# Patient Record
Sex: Female | Born: 1980 | Race: Black or African American | Hispanic: No | Smoking: Former smoker
Health system: Southern US, Community
[De-identification: ages and names within clinical notes are randomized; demographics above are authoritative.]

## PROBLEM LIST (undated history)

## (undated) DIAGNOSIS — I951 Orthostatic hypotension: Secondary | ICD-10-CM

## (undated) DIAGNOSIS — E7800 Pure hypercholesterolemia, unspecified: Secondary | ICD-10-CM

## (undated) DIAGNOSIS — T7840XA Allergy, unspecified, initial encounter: Secondary | ICD-10-CM

## (undated) DIAGNOSIS — N946 Dysmenorrhea, unspecified: Secondary | ICD-10-CM

## (undated) DIAGNOSIS — A64 Unspecified sexually transmitted disease: Secondary | ICD-10-CM

## (undated) HISTORY — PX: ABDOMINAL HYSTERECTOMY: SHX81

## (undated) HISTORY — PX: FRACTURE SURGERY: SHX138

## (undated) HISTORY — DX: Unspecified sexually transmitted disease: A64

## (undated) HISTORY — DX: Orthostatic hypotension: I95.1

## (undated) HISTORY — DX: Pure hypercholesterolemia, unspecified: E78.00

## (undated) HISTORY — DX: Dysmenorrhea, unspecified: N94.6

## (undated) HISTORY — PX: OTHER SURGICAL HISTORY: SHX169

## (undated) HISTORY — DX: Allergy, unspecified, initial encounter: T78.40XA

---

## 2010-11-13 ENCOUNTER — Inpatient Hospital Stay (INDEPENDENT_AMBULATORY_CARE_PROVIDER_SITE_OTHER)
Admission: RE | Admit: 2010-11-13 | Discharge: 2010-11-13 | Disposition: A | Discharge status: Home / Self Care | Payer: PRIVATE HEALTH INSURANCE | Source: Ambulatory Visit | Attending: Family Medicine | Admitting: Family Medicine

## 2010-11-13 DIAGNOSIS — J45909 Unspecified asthma, uncomplicated: Secondary | ICD-10-CM

## 2011-11-24 ENCOUNTER — Ambulatory Visit (INDEPENDENT_AMBULATORY_CARE_PROVIDER_SITE_OTHER): Payer: PRIVATE HEALTH INSURANCE | Admitting: Family Medicine

## 2011-11-24 ENCOUNTER — Other Ambulatory Visit (HOSPITAL_COMMUNITY)
Admission: RE | Admit: 2011-11-24 | Discharge: 2011-11-24 | Disposition: A | Discharge status: Home / Self Care | Payer: No Typology Code available for payment source | Source: Ambulatory Visit | Attending: Family Medicine | Admitting: Family Medicine

## 2011-11-24 ENCOUNTER — Encounter: Payer: Self-pay | Admitting: Family Medicine

## 2011-11-24 VITALS — BP 120/78 | HR 80 | Temp 98.1°F | Wt 191.0 lb

## 2011-11-24 DIAGNOSIS — Z01419 Encounter for gynecological examination (general) (routine) without abnormal findings: Secondary | ICD-10-CM | POA: Insufficient documentation

## 2011-11-24 DIAGNOSIS — G5603 Carpal tunnel syndrome, bilateral upper limbs: Secondary | ICD-10-CM

## 2011-11-24 DIAGNOSIS — Z0000 Encounter for general adult medical examination without abnormal findings: Secondary | ICD-10-CM | POA: Insufficient documentation

## 2011-11-24 DIAGNOSIS — R5383 Other fatigue: Secondary | ICD-10-CM | POA: Insufficient documentation

## 2011-11-24 DIAGNOSIS — R5381 Other malaise: Secondary | ICD-10-CM

## 2011-11-24 DIAGNOSIS — Z1159 Encounter for screening for other viral diseases: Secondary | ICD-10-CM | POA: Insufficient documentation

## 2011-11-24 DIAGNOSIS — Z136 Encounter for screening for cardiovascular disorders: Secondary | ICD-10-CM

## 2011-11-24 DIAGNOSIS — G5600 Carpal tunnel syndrome, unspecified upper limb: Secondary | ICD-10-CM

## 2011-11-24 LAB — COMPREHENSIVE METABOLIC PANEL
BUN: 10 mg/dL (ref 6–23)
CO2: 24 mEq/L (ref 19–32)
Creatinine, Ser: 0.7 mg/dL (ref 0.4–1.2)
GFR: 119.75 mL/min (ref >60.00)
Glucose, Bld: 92 mg/dL (ref 70–99)
Sodium: 140 mEq/L (ref 135–145)
Total Bilirubin: 1 mg/dL (ref 0.3–1.2)
Total Protein: 7.4 g/dL (ref 6.0–8.3)

## 2011-11-24 LAB — CBC WITH DIFFERENTIAL/PLATELET
Basophils Relative: 0.6 % (ref 0.0–3.0)
Eosinophils Relative: 3 % (ref 0.0–5.0)
HCT: 37.8 % (ref 36.0–46.0)
Hemoglobin: 12.9 g/dL (ref 12.0–15.0)
Lymphs Abs: 1.5 10*3/uL (ref 0.7–4.0)
Monocytes Relative: 10.2 % (ref 3.0–12.0)
Neutro Abs: 2.8 10*3/uL (ref 1.4–7.7)
RBC: 4.34 Mil/uL (ref 3.87–5.11)
WBC: 5.1 10*3/uL (ref 4.5–10.5)

## 2011-11-24 LAB — LIPID PANEL
Cholesterol: 208 mg/dL — ABNORMAL HIGH (ref 0–200)
VLDL: 12.8 mg/dL (ref 0.0–40.0)

## 2011-11-24 LAB — LDL CHOLESTEROL, DIRECT: Direct LDL: 160.9 mg/dL

## 2011-11-24 NOTE — Patient Instructions (Signed)
It was so nice to meet you. We will call you with your lab results. You can go to any pharmacy and buy wrist splints for carpal tunnel syndrome- please wear them at night. If no improvement, let me know.

## 2011-11-24 NOTE — Progress Notes (Signed)
Subjective:    Patient ID: Patty Garner, female    DOB: Feb 07, 1981, 31 y.o.   MRN: [id number]  HPI  31 yo here to establish care and for cpx.  G0- no h/o abnormal pap smears. Sexually active with females only, not on any OCPs. Periods are very heavy.  Sometimes gets lightheaded when she stands up. Tried OCPs in past, did not like how they made her feel.  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  At end of day, hands and fingers ache.  No radiculopathy or hand weakness. Sometimes quite fatigued.  No CP or SOB.  Patient Active Problem List  Diagnoses  . Fatigue  . Routine general medical examination at a health care facility  . Carpal tunnel syndrome, bilateral   No past medical history on file. No past surgical history on file. History  Substance Use Topics  . Smoking status: Current Some Day Smoker  . Smokeless tobacco: Not on file  . Alcohol Use: Not on file   No family history on file. Allergies  Allergen Reactions  . Tylenol With Codeine #3 (Acetaminophen-Codeine) Nausea And Vomiting   No current outpatient prescriptions on file prior to visit.   The PMH, PSH, Social History, Family History, Medications, and allergies have been reviewed in [facility], and have been updated if relevant.    Review of Systems See HPI Patient reports no  vision/ hearing changes,anorexia, weight change, fever ,adenopathy, persistant / recurrent hoarseness, swallowing issues, chest pain, edema,persistant / recurrent cough, hemoptysis, dyspnea(rest, exertional, paroxysmal nocturnal), gastrointestinal  bleeding (melena, rectal bleeding), abdominal pain, excessive heart burn, GU symptoms(dysuria, hematuria, pyuria, voiding/incontinence  Issues) syncope, focal weakness, severe memory loss, concerning skin lesions, depression, anxiety, abnormal bruising/bleeding, major joint swelling, breast masses or abnormal vaginal bleeding.       Objective:   Physical Exam BP 120/78  Pulse 80  Temp(Src)  98.1 F (36.7 C) (Oral)  Wt 191 lb (86.637 kg)  LMP 11/20/2011  General:  Well-developed,well-nourished,in no acute distress; alert,appropriate and cooperative throughout examination Head:  normocephalic and atraumatic.   Eyes:  vision grossly intact, pupils equal, pupils round, and pupils reactive to light.   Ears:  R ear normal and L ear normal.   Nose:  no external deformity.   Mouth:  good dentition.   Neck:  No deformities, masses, or tenderness noted. Breasts:  No mass, nodules, thickening, tenderness, bulging, retraction, inflamation, nipple discharge or skin changes noted.   Lungs:  Normal respiratory effort, chest expands symmetrically. Lungs are clear to auscultation, no crackles or wheezes. Heart:  Normal rate and regular rhythm. S1 and S2 normal without gallop, murmur, click, rub or other extra sounds. Abdomen:  Bowel sounds positive,abdomen soft and non-tender without masses, organomegaly or hernias noted. Rectal:  no external abnormalities.   Genitalia:  Pelvic Exam:        External: normal female genitalia without lesions or masses        Vagina: normal without lesions or masses        Cervix: normal without lesions or masses        Adnexa: normal bimanual exam without masses or fullness        Uterus: normal by palpation        Pap smear: performed Msk:  No deformity or scoliosis noted of thoracic or lumbar spine, positive Tinel test  Extremities:  No clubbing, cyanosis, edema, or deformity noted with normal full range of motion of all joints.   Neurologic:  alert & oriented X3 and gait normal.   Skin:  Intact without suspicious lesions or rashes Cervical Nodes:  No lymphadenopathy noted Axillary Nodes:  No palpable lymphadenopathy Psych:  Cognition and judgment appear intact. Alert and cooperative with normal attention span and concentration. No apparent delusions, illusions, hallucinations        Assessment & Plan:   1. Fatigue  Likely due to busy lifestyle  but will check other labs to rule out other reversible causes. CBC with Differential, TSH, T4, Free  2. Routine general medical examination at a health care facility  Reviewed preventive care protocols, scheduled due services, and updated immunizations Discussed nutrition, exercise, diet, and healthy lifestyle.  Comprehensive metabolic panel, Cytology - PAP  3. Carpal tunnel syndrome, bilateral  Advised night splinting and supportive care- see pt instructions for details.   4. Screening for ischemic heart disease  Lipid Panel

## 2011-12-02 ENCOUNTER — Encounter: Payer: Self-pay | Admitting: *Deleted

## 2011-12-02 LAB — HM PAP SMEAR: HM Pap smear: NORMAL

## 2011-12-26 ENCOUNTER — Other Ambulatory Visit: Payer: Self-pay | Admitting: Family Medicine

## 2011-12-26 MED ORDER — NORETHINDRONE ACET-ETHINYL EST 1.5-30 MG-MCG PO TABS: 1.0000 | ORAL_TABLET | Freq: Every day | ORAL | Status: DC

## 2011-12-26 NOTE — Telephone Encounter (Signed)
Rx for loestrin sent to her pharmacy.

## 2011-12-26 NOTE — Telephone Encounter (Signed)
Advised patient

## 2011-12-26 NOTE — Telephone Encounter (Signed)
Pt is having really bad cramps and wanted an RX for birth control.

## 2012-08-23 ENCOUNTER — Ambulatory Visit (INDEPENDENT_AMBULATORY_CARE_PROVIDER_SITE_OTHER): Payer: PRIVATE HEALTH INSURANCE | Admitting: Family Medicine

## 2012-08-23 ENCOUNTER — Encounter: Payer: Self-pay | Admitting: Family Medicine

## 2012-08-23 VITALS — BP 100/64 | HR 88 | Temp 97.8°F | Wt 204.0 lb

## 2012-08-23 DIAGNOSIS — N920 Excessive and frequent menstruation with regular cycle: Secondary | ICD-10-CM | POA: Insufficient documentation

## 2012-08-23 MED ORDER — ALBUTEROL SULFATE (2.5 MG/3ML) 0.083% IN NEBU: 2.5000 mg | INHALATION_SOLUTION | Freq: Four times a day (QID) | RESPIRATORY_TRACT | Status: DC | PRN

## 2012-08-23 MED ORDER — NORETHINDRONE ACET-ETHINYL EST 1.5-30 MG-MCG PO TABS: 1.0000 | ORAL_TABLET | Freq: Every day | ORAL | Status: DC

## 2012-08-23 MED ORDER — ALBUTEROL SULFATE HFA 108 (90 BASE) MCG/ACT IN AERS: 2.0000 | INHALATION_SPRAY | Freq: Four times a day (QID) | RESPIRATORY_TRACT | Status: DC | PRN

## 2012-08-23 NOTE — Progress Notes (Signed)
  Subjective:    Patient ID: Patty Garner, female    DOB: 07-Mar-1981, 32 y.o.   MRN: [id number]  HPI 32 yo here for follow up.  Menorrhagia- Sexually active with females only but we started Junel last year to help with menorrhagia.  This has helped tremendously with her cramping and heavy periods. She is very pleased with it and would like it refilled.  Last pap smear was normal in 11/2011.  She has no complaints today.   Patient Active Problem List  Diagnosis  . Fatigue  . Carpal tunnel syndrome, bilateral  . Menorrhagia   No past medical history on file. No past surgical history on file. History  Substance Use Topics  . Smoking status: Never Smoker   . Smokeless tobacco: Not on file  . Alcohol Use: Not on file   No family history on file. Allergies  Allergen Reactions  . Tylenol With Codeine #3 (Acetaminophen-Codeine) Nausea And Vomiting   Current Outpatient Prescriptions on File Prior to Visit  Medication Sig Dispense Refill  . Ginkgo Biloba (GNP GINGKO BILOBA EXTRACT PO) Take one by mouth daily      . Multiple Vitamin (MULTIVITAMIN) tablet Take 1 tablet by mouth daily.      . Norethindrone Acetate-Ethinyl Estradiol (JUNEL,LOESTRIN,MICROGESTIN) 1.5-30 MG-MCG tablet Take 1 tablet by mouth daily.  1 Package  11   The PMH, PSH, Social History, Family History, Medications, and allergies have been reviewed in [facility], and have been updated if relevant.    Review of Systems See HPI      Objective:   Physical Exam BP 100/64  Pulse 88  Temp 97.8 F (36.6 C)  Wt 204 lb (92.534 kg)  General:  Well-developed,well-nourished,in no acute distress; alert,appropriate and cooperative throughout examination Head:  normocephalic and atraumatic.   Eyes:  vision grossly intact, pupils equal, pupils round, and pupils reactive to light.   Ears:  R ear normal and L ear normal.   Nose:  no external deformity.   Mouth:  good dentition.   Lungs:  Normal respiratory effort, chest  expands symmetrically. Lungs are clear to auscultation, no crackles or wheezes. Heart:  Normal rate and regular rhythm. S1 and S2 normal without gallop, murmur, click, rub or other extra sounds. Abdomen:  Bowel sounds positive,abdomen soft and non-tender without masses, organomegaly or hernias noted. Extremities:  No clubbing, cyanosis, edema, or deformity noted with normal full range of motion of all joints.   Neurologic:  alert & oriented X3 and gait normal.   Skin:  Intact without suspicious lesions or rashes Psych:  Cognition and judgment appear intact. Alert and cooperative with normal attention span and concentration. No apparent delusions, illusions, hallucinations        Assessment & Plan:   1. Menorrhagia    Improved with Junel.  She is a non smoker.  Rx refilled.

## 2012-11-24 ENCOUNTER — Other Ambulatory Visit: Payer: Self-pay | Admitting: Family Medicine

## 2013-07-05 ENCOUNTER — Other Ambulatory Visit: Payer: Self-pay | Admitting: Family Medicine

## 2013-08-03 ENCOUNTER — Other Ambulatory Visit: Payer: Self-pay | Admitting: Family Medicine

## 2013-08-05 ENCOUNTER — Ambulatory Visit (INDEPENDENT_AMBULATORY_CARE_PROVIDER_SITE_OTHER): Payer: PRIVATE HEALTH INSURANCE | Admitting: Internal Medicine

## 2013-08-05 ENCOUNTER — Encounter: Payer: Self-pay | Admitting: Internal Medicine

## 2013-08-05 VITALS — BP 120/78 | HR 89 | Temp 98.8°F | Wt 202.0 lb

## 2013-08-05 DIAGNOSIS — N920 Excessive and frequent menstruation with regular cycle: Secondary | ICD-10-CM

## 2013-08-05 MED ORDER — NORETHINDRONE ACET-ETHINYL EST 1.5-30 MG-MCG PO TABS: ORAL_TABLET | ORAL | Status: DC

## 2013-08-05 NOTE — Progress Notes (Signed)
Pre-visit discussion using our clinic review tool. No additional management support is needed unless otherwise documented below in the visit note.  

## 2013-08-05 NOTE — Progress Notes (Signed)
Subjective:    Patient ID: Patty Garner, female    DOB: July 02, 1981, 33 y.o.   MRN: [id number]  HPI  Pt presents to the clinic today for birth control refill. She was started on Junel for menorrhagia. She reports improvement in her symptoms. She denies any negative side effects from the medication. Her last pap was 11/2011 and normal. She reports she gets them done every 3 years and will be due next year.  Review of Systems      History reviewed. No pertinent past medical history.  Current Outpatient Prescriptions  Medication Sig Dispense Refill  . Multiple Vitamin (MULTIVITAMIN) tablet Take 1 tablet by mouth daily.      . Multiple Vitamins-Minerals (HAIR/SKIN/NAILS PO) Take 2 capsules by mouth. Gummy      . Norethindrone Acetate-Ethinyl Estradiol (JUNEL 1.5/30) 1.5-30 MG-MCG tablet TAKE 1 TABLET BY MOUTH EVERY DAY  21 tablet  11   No current facility-administered medications for this visit.    Allergies  Allergen Reactions  . Tylenol With Codeine #3 [Acetaminophen-Codeine] Nausea And Vomiting    History reviewed. No pertinent family history.  History   Social History  . Marital Status: Single    Spouse Name: N/A    Number of Children: N/A  . Years of Education: N/A   Occupational History  . Not on file.   Social History Main Topics  . Smoking status: Former Research scientist (life sciences)  . Smokeless tobacco: Not on file  . Alcohol Use: Yes     Comment: moderate  . Drug Use: No  . Sexual Activity: Not on file   Other Topics Concern  . Not on file   Social History Narrative  . No narrative on file     Constitutional: Denies fever, malaise, fatigue, headache or abrupt weight changes.  Respiratory: Denies difficulty breathing, shortness of breath, cough or sputum production.   GU: Denies urgency, frequency, pain with urination, burning sensation, blood in urine, odor or discharge.   No other specific complaints in a complete review of systems (except as listed in HPI  above).  Objective:   Physical Exam  BP 120/78  Pulse 89  Temp(Src) 98.8 F (37.1 C) (Oral)  Wt 202 lb (91.627 kg)  SpO2 98%  LMP 08/02/2012 Wt Readings from Last 3 Encounters:  08/05/13 202 lb (91.627 kg)  08/23/12 204 lb (92.534 kg)  11/24/11 191 lb (86.637 kg)    General: Appears her stated age, obese but well developed, well nourished in NAD. Cardiovascular: Normal rate and rhythm. S1,S2 noted.  No murmur, rubs or gallops noted. No JVD or BLE edema. No carotid bruits noted. Pulmonary/Chest: Normal effort and positive vesicular breath sounds. No respiratory distress. No wheezes, rales or ronchi noted.  Abdomen: Soft and nontender. Normal bowel sounds, no bruits noted. No distention or masses noted. Liver, spleen and kidneys non palpable.   BMET    Component Value Date/Time   NA 140 11/24/2011 1154   K 3.7 11/24/2011 1154   CL 105 11/24/2011 1154   CO2 24 11/24/2011 1154   GLUCOSE 92 11/24/2011 1154   BUN 10 11/24/2011 1154   CREATININE 0.7 11/24/2011 1154   CALCIUM 9.2 11/24/2011 1154    Lipid Panel     Component Value Date/Time   CHOL 208* 11/24/2011 1154   TRIG 64.0 11/24/2011 1154   HDL 49.70 11/24/2011 1154   CHOLHDL 4 11/24/2011 1154   VLDL 12.8 11/24/2011 1154    CBC    Component Value Date/Time  WBC 5.1 11/24/2011 1154   RBC 4.34 11/24/2011 1154   HGB 12.9 11/24/2011 1154   HCT 37.8 11/24/2011 1154   PLT 270.0 11/24/2011 1154   MCV 87.2 11/24/2011 1154   MCHC 34.1 11/24/2011 1154   RDW 13.4 11/24/2011 1154   LYMPHSABS 1.5 11/24/2011 1154   MONOABS 0.5 11/24/2011 1154   EOSABS 0.1 11/24/2011 1154   BASOSABS 0.0 11/24/2011 1154    Hgb A1C No results found for this basename: HGBA1C         Assessment & Plan:

## 2013-08-05 NOTE — Patient Instructions (Signed)
Oral Contraception Information  Oral contraceptive pills (OCPs) are medicines taken to prevent pregnancy. OCPs work by preventing the ovaries from releasing eggs. The hormones in OCPs also cause the cervical mucus to thicken, preventing the sperm from entering the uterus. The hormones also cause the uterine lining to become thin, not allowing a fertilized egg to attach to the inside of the uterus. OCPs are highly effective when taken exactly as prescribed. However, OCPs do not prevent sexually transmitted diseases (STDs). Safe sex practices, such as using condoms along with the pill, can help prevent STDs.   Before taking the pill, you may have a physical exam and Pap test. Your health care provider may order blood tests. The health care provider will make sure you are a good candidate for oral contraception. Discuss with your health care provider the possible side effects of the OCP you may be prescribed. When starting an OCP, it can take 2 to 3 months for the body to adjust to the changes in hormone levels in your body.   TYPES OF ORAL CONTRACEPTION  · The combination pill This pill contains estrogen and progestin (synthetic progesterone) hormones. The combination pill comes in 21-day, 28-day, or 91-day packs. Some types of combination pills are meant to be taken continuously (365-day pills). With 21-day packs, you do not take pills for 7 days after the last pill. With 28-day packs, the pill is taken every day. The last 7 pills are without hormones. Certain types of pills have more than 21 hormone-containing pills. With 91-day packs, the first 84 pills contain both hormones, and the last 7 pills contain no hormones or contain estrogen only.  · The minipill This pill contains the progesterone hormone only. The pill is taken every day continuously. It is very important to take the pill at the same time each day. The minipill comes in packs of 28 pills. All 28 pills contain the hormone.    ADVANTAGES OF ORAL  CONTRACEPTIVE PILLS  · Decreases premenstrual symptoms.    · Treats menstrual period cramps.    · Regulates the menstrual cycle.    · Decreases a heavy menstrual flow.    · May treat acne, depending on the type of pill.    · Treats abnormal uterine bleeding.    · Treats polycystic ovarian syndrome.    · Treats endometriosis.    · Can be used as emergency contraception.    THINGS THAT CAN MAKE ORAL CONTRACEPTIVE PILLS LESS EFFECTIVE  OCPs can be less effective if:   · You forget to take the pill at the same time every day.    · You have a stomach or intestinal disease that lessens the absorption of the pill.    · You take OCPs with other medicines that make OCPs less effective, such as antibiotics, certain HIV medicines, and some seizure medicines.    · You take expired OCPs.    · You forget to restart the pill on day 7, when using the packs of 21 pills.    RISKS ASSOCIATED WITH ORAL CONTRACEPTIVE PILLS   Oral contraceptive pills can sometimes cause side effects, such as:  · Headache.  · Nausea.  · Breast tenderness.  · Irregular bleeding or spotting.  Combination pills are also associated with a small increased risk of:  · Blood clots.  · Heart attack.  · Stroke.  Document Released: 09/27/2002 Document Revised: 04/27/2013 Document Reviewed: 12/26/2012  [employer]® Patient Information ©2014 [employer].

## 2013-08-05 NOTE — Assessment & Plan Note (Signed)
Well controlled on Junel Will refill today Pap due next year

## 2014-06-07 ENCOUNTER — Ambulatory Visit (INDEPENDENT_AMBULATORY_CARE_PROVIDER_SITE_OTHER): Payer: BC Managed Care – PPO | Admitting: Family Medicine

## 2014-06-07 ENCOUNTER — Encounter: Payer: Self-pay | Admitting: Family Medicine

## 2014-06-07 VITALS — BP 116/62 | HR 81 | Temp 98.0°F | Ht 63.0 in | Wt 194.5 lb

## 2014-06-07 DIAGNOSIS — N921 Excessive and frequent menstruation with irregular cycle: Secondary | ICD-10-CM

## 2014-06-07 DIAGNOSIS — Z309 Encounter for contraceptive management, unspecified: Secondary | ICD-10-CM | POA: Insufficient documentation

## 2014-06-07 DIAGNOSIS — Z0000 Encounter for general adult medical examination without abnormal findings: Secondary | ICD-10-CM

## 2014-06-07 DIAGNOSIS — Z3041 Encounter for surveillance of contraceptive pills: Secondary | ICD-10-CM

## 2014-06-07 LAB — CBC WITH DIFFERENTIAL/PLATELET
BASOS ABS: 0 10*3/uL (ref 0.0–0.1)
BASOS PCT: 0.4 % (ref 0.0–3.0)
EOS ABS: 0.1 10*3/uL (ref 0.0–0.7)
Eosinophils Relative: 1.8 % (ref 0.0–5.0)
HCT: 37.5 % (ref 36.0–46.0)
HEMOGLOBIN: 12.4 g/dL (ref 12.0–15.0)
Lymphocytes Relative: 34.4 % (ref 12.0–46.0)
Lymphs Abs: 2 10*3/uL (ref 0.7–4.0)
MCHC: 33.2 g/dL (ref 30.0–36.0)
MCV: 86.9 fl (ref 78.0–100.0)
MONO ABS: 0.4 10*3/uL (ref 0.1–1.0)
Monocytes Relative: 7.3 % (ref 3.0–12.0)
NEUTROS ABS: 3.3 10*3/uL (ref 1.4–7.7)
NEUTROS PCT: 56.1 % (ref 43.0–77.0)
Platelets: 297 10*3/uL (ref 150.0–400.0)
RBC: 4.32 Mil/uL (ref 3.87–5.11)
RDW: 13.4 % (ref 11.5–15.5)
WBC: 5.8 10*3/uL (ref 4.0–10.5)

## 2014-06-07 LAB — LIPID PANEL
CHOLESTEROL: 245 mg/dL — AB (ref 0–200)
HDL: 62 mg/dL (ref >39.00)
LDL Cholesterol: 159 mg/dL — ABNORMAL HIGH (ref 0–99)
NONHDL: 183
Total CHOL/HDL Ratio: 4
Triglycerides: 118 mg/dL (ref 0.0–149.0)
VLDL: 23.6 mg/dL (ref 0.0–40.0)

## 2014-06-07 LAB — COMPREHENSIVE METABOLIC PANEL
ALBUMIN: 4.1 g/dL (ref 3.5–5.2)
ALT: 42 U/L — AB (ref 0–35)
AST: 28 U/L (ref 0–37)
Alkaline Phosphatase: 37 U/L — ABNORMAL LOW (ref 39–117)
BUN: 9 mg/dL (ref 6–23)
CALCIUM: 9.1 mg/dL (ref 8.4–10.5)
CHLORIDE: 105 meq/L (ref 96–112)
CO2: 23 meq/L (ref 19–32)
Creatinine, Ser: 0.7 mg/dL (ref 0.4–1.2)
GFR: 126.02 mL/min (ref >60.00)
GLUCOSE: 105 mg/dL — AB (ref 70–99)
POTASSIUM: 3.9 meq/L (ref 3.5–5.1)
SODIUM: 137 meq/L (ref 135–145)
TOTAL PROTEIN: 7.4 g/dL (ref 6.0–8.3)
Total Bilirubin: 0.9 mg/dL (ref 0.2–1.2)

## 2014-06-07 LAB — TSH: TSH: 0.59 u[IU]/mL (ref 0.35–4.50)

## 2014-06-07 MED ORDER — NORETHINDRONE ACET-ETHINYL EST 1.5-30 MG-MCG PO TABS: ORAL_TABLET | ORAL | Status: DC

## 2014-06-07 NOTE — Patient Instructions (Signed)
Great to see you. I will call you with your lab results.   

## 2014-06-07 NOTE — Progress Notes (Signed)
Subjective:    Patient ID: Patty Garner, female    DOB: 06-Dec-1980, 33 y.o.   MRN: [id number]  HPI 33 yo here for follow up.  Menorrhagia- Sexually active with females only but we started Junel to help with menorrhagia.  This has helped tremendously with her cramping and heavy periods. She is very pleased with it and would like it refilled.  Last pap smear was normal (done by me ) on 11/24/2011.  She has no complaints today.  Lab Results  Component Value Date   CHOL 208* 11/24/2011   HDL 49.70 11/24/2011   LDLDIRECT 160.9 11/24/2011   TRIG 64.0 11/24/2011   CHOLHDL 4 11/24/2011   Lab Results  Component Value Date   CREATININE 0.7 11/24/2011   Lab Results  Component Value Date   TSH 0.69 11/24/2011   Lab Results  Component Value Date   NA 140 11/24/2011   K 3.7 11/24/2011   CL 105 11/24/2011   CO2 24 11/24/2011    Patient Active Problem List   Diagnosis Date Noted  . Contraceptive management 06/07/2014  . Menorrhagia 08/23/2012  . Carpal tunnel syndrome, bilateral 11/24/2011   No past medical history on file. No past surgical history on file. History  Substance Use Topics  . Smoking status: Former Research scientist (life sciences)  . Smokeless tobacco: Not on file  . Alcohol Use: Yes     Comment: moderate   No family history on file. Allergies  Allergen Reactions  . Tylenol With Codeine #3 [Acetaminophen-Codeine] Nausea And Vomiting   Current Outpatient Prescriptions on File Prior to Visit  Medication Sig Dispense Refill  . Multiple Vitamin (MULTIVITAMIN) tablet Take 1 tablet by mouth daily.    . Multiple Vitamins-Minerals (HAIR/SKIN/NAILS PO) Take 2 capsules by mouth. Gummy     No current facility-administered medications on file prior to visit.   The PMH, PSH, Social History, Family History, Medications, and allergies have been reviewed in [facility], and have been updated if relevant.    Review of Systems See HPI  No dysuria No pelvic pain    Objective:   Physical  Exam BP 116/62 mmHg  Pulse 81  Temp(Src) 98 F (36.7 C) (Oral)  Ht 5\' 3"  (1.6 m)  Wt 194 lb 8 oz (88.225 kg)  BMI 34.46 kg/m2  SpO2 98%  LMP 06/06/2014  General:  Well-developed,well-nourished,in no acute distress; alert,appropriate and cooperative throughout examination Head:  normocephalic and atraumatic.   Eyes:  vision grossly intact, pupils equal, pupils round, and pupils reactive to light.   Ears:  R ear normal and L ear normal.   Nose:  no external deformity.   Mouth:  good dentition.   Lungs:  Normal respiratory effort, chest expands symmetrically. Lungs are clear to auscultation, no crackles or wheezes. Heart:  Normal rate and regular rhythm. S1 and S2 normal without gallop, murmur, click, rub or other extra sounds. Abdomen:  Bowel sounds positive,abdomen soft and non-tender without masses, organomegaly or hernias noted. Extremities:  No clubbing, cyanosis, edema, or deformity noted with normal full range of motion of all joints.   Neurologic:  alert & oriented X3 and gait normal.   Skin:  Intact without suspicious lesions or rashes Psych:  Cognition and judgment appear intact. Alert and cooperative with normal attention span and concentration. No apparent delusions, illusions, hallucinations        Assessment & Plan:   Encounter for surveillance of contraceptive pills  Menorrhagia with irregular cycle  Well woman exam (no gynecological exam) -  Plan: CBC with Differential, Comprehensive metabolic panel, Lipid panel, TSH Improved with Junel.  She is a non smoker.  Rx refilled.  Orders Placed This Encounter  Procedures  . CBC with Differential  . Comprehensive metabolic panel  . Lipid panel  . TSH

## 2014-06-07 NOTE — Progress Notes (Signed)
Pre visit review using our clinic review tool, if applicable. No additional management support is needed unless otherwise documented below in the visit note. 

## 2014-06-09 ENCOUNTER — Encounter: Payer: Self-pay | Admitting: *Deleted

## 2014-10-26 ENCOUNTER — Ambulatory Visit (INDEPENDENT_AMBULATORY_CARE_PROVIDER_SITE_OTHER): Payer: BLUE CROSS/BLUE SHIELD | Admitting: Family Medicine

## 2014-10-26 ENCOUNTER — Encounter: Payer: Self-pay | Admitting: Family Medicine

## 2014-10-26 VITALS — BP 132/70 | HR 101 | Temp 98.1°F | Wt 194.0 lb

## 2014-10-26 DIAGNOSIS — J309 Allergic rhinitis, unspecified: Secondary | ICD-10-CM | POA: Insufficient documentation

## 2014-10-26 DIAGNOSIS — J3089 Other allergic rhinitis: Secondary | ICD-10-CM

## 2014-10-26 MED ORDER — FLUTICASONE PROPIONATE 50 MCG/ACT NA SUSP: 2.0000 | Freq: Every day | NASAL | Status: DC

## 2014-10-26 MED ORDER — CETIRIZINE HCL 10 MG PO TABS: 10.0000 mg | ORAL_TABLET | Freq: Every day | ORAL | Status: DC

## 2014-10-26 NOTE — Patient Instructions (Signed)
Great to see you. Buy Zyrtec D behind the counter- take it every morning.  You can then take benadryl at bedtime.   Flonase as directed.  Keep me updated.

## 2014-10-26 NOTE — Progress Notes (Signed)
   Subjective:   Patient ID: Patty Garner, female    DOB: Jun 08, 1981, 34 y.o.   MRN: [id number]  LEANNE SISLER is a pleasant 34 y.o. year old female who presents to clinic today with sinus pressure  on 10/26/2014  HPI:  H/o allergic- worse than usual this year.  Zyrtec not helping much.  Still having runny nose, itchy eyes, sinus pressure. No fevers, chills or cough.  No CP or SOB.  Current Outpatient Prescriptions on File Prior to Visit  Medication Sig Dispense Refill  . Multiple Vitamin (MULTIVITAMIN) tablet Take 1 tablet by mouth daily.    . Multiple Vitamins-Minerals (HAIR/SKIN/NAILS PO) Take 2 capsules by mouth. Gummy    . Norethindrone Acetate-Ethinyl Estradiol (JUNEL 1.5/30) 1.5-30 MG-MCG tablet TAKE 1 TABLET BY MOUTH EVERY DAY 21 tablet 11   No current facility-administered medications on file prior to visit.    Allergies  Allergen Reactions  . Tylenol With Codeine #3 [Acetaminophen-Codeine] Nausea And Vomiting    History reviewed. No pertinent past medical history.  History reviewed. No pertinent past surgical history.  History reviewed. No pertinent family history.  History   Social History  . Marital Status: Single    Spouse Name: N/A  . Number of Children: N/A  . Years of Education: N/A   Occupational History  . Not on file.   Social History Main Topics  . Smoking status: Former Research scientist (life sciences)  . Smokeless tobacco: Not on file  . Alcohol Use: Yes     Comment: moderate  . Drug Use: No  . Sexual Activity: Not on file   Other Topics Concern  . Not on file   Social History Narrative   The PMH, PSH, Social History, Family History, Medications, and allergies have been reviewed in [facility], and have been updated if relevant.   Review of Systems  Constitutional: Negative.   HENT: Positive for congestion, postnasal drip, rhinorrhea and sinus pressure. Negative for ear pain, facial swelling, hearing loss and trouble swallowing.   Eyes: Positive for itching.    Respiratory: Negative.   Cardiovascular: Negative.   Skin: Negative for rash.  All other systems reviewed and are negative.      Objective:    BP 132/70 mmHg  Pulse 101  Temp(Src) 98.1 F (36.7 C) (Oral)  Wt 194 lb (87.998 kg)  SpO2 98%  LMP 10/24/2014   Physical Exam  Constitutional: She is oriented to person, place, and time. She appears well-developed and well-nourished. No distress.  HENT:  Head: Normocephalic.  Right Ear: Hearing and tympanic membrane normal.  Left Ear: Hearing and tympanic membrane normal.  Nose: Mucosal edema and rhinorrhea present. Right sinus exhibits no maxillary sinus tenderness and no frontal sinus tenderness. Left sinus exhibits no maxillary sinus tenderness and no frontal sinus tenderness.  Mouth/Throat: Posterior oropharyngeal erythema present. No oropharyngeal exudate, posterior oropharyngeal edema or tonsillar abscesses.  Eyes: Conjunctivae are normal.  Neck: Normal range of motion.  Cardiovascular: Normal rate and regular rhythm.   Pulmonary/Chest: Effort normal and breath sounds normal. No respiratory distress.  Musculoskeletal: She exhibits no edema.  Neurological: She is alert and oriented to person, place, and time. No cranial nerve deficit.  Skin: Skin is warm and dry.  Psychiatric: She has a normal mood and affect. Her behavior is normal. Judgment and thought content normal.  Nursing note and vitals reviewed.         Assessment & Plan:   Other allergic rhinitis No Follow-up on file.

## 2014-10-26 NOTE — Progress Notes (Signed)
Pre visit review using our clinic review tool, if applicable. No additional management support is needed unless otherwise documented below in the visit note. 

## 2014-10-26 NOTE — Assessment & Plan Note (Signed)
Deteriorated. Advised using zyrtec D instead of zyrtec, could also add benadryl at bedtime but discussed possible side effects of taking too much of an anti histamine. Add nasal steroid- eRx sent for flonase. Call or return to clinic prn if these symptoms worsen or fail to improve as anticipated. The patient indicates understanding of these issues and agrees with the plan.

## 2014-12-31 ENCOUNTER — Ambulatory Visit (INDEPENDENT_AMBULATORY_CARE_PROVIDER_SITE_OTHER): Payer: BLUE CROSS/BLUE SHIELD | Admitting: Physician Assistant

## 2014-12-31 VITALS — BP 110/68 | HR 94 | Temp 98.5°F | Resp 16 | Ht 63.5 in | Wt 194.8 lb

## 2014-12-31 DIAGNOSIS — L03119 Cellulitis of unspecified part of limb: Secondary | ICD-10-CM

## 2014-12-31 DIAGNOSIS — L02419 Cutaneous abscess of limb, unspecified: Secondary | ICD-10-CM

## 2014-12-31 MED ORDER — DOXYCYCLINE HYCLATE 100 MG PO CAPS: 100.0000 mg | ORAL_CAPSULE | Freq: Two times a day (BID) | ORAL | Status: DC

## 2014-12-31 MED ORDER — IBUPROFEN 600 MG PO TABS: 600.0000 mg | ORAL_TABLET | Freq: Three times a day (TID) | ORAL | Status: DC | PRN

## 2014-12-31 MED ORDER — FLUCONAZOLE 150 MG PO TABS: 150.0000 mg | ORAL_TABLET | Freq: Once | ORAL | Status: DC

## 2014-12-31 NOTE — Patient Instructions (Signed)

## 2015-01-02 ENCOUNTER — Ambulatory Visit (INDEPENDENT_AMBULATORY_CARE_PROVIDER_SITE_OTHER): Payer: BLUE CROSS/BLUE SHIELD | Admitting: Physician Assistant

## 2015-01-02 VITALS — BP 110/70 | HR 100 | Temp 98.4°F | Resp 16 | Ht 63.5 in | Wt 198.8 lb

## 2015-01-02 DIAGNOSIS — L02419 Cutaneous abscess of limb, unspecified: Secondary | ICD-10-CM

## 2015-01-02 DIAGNOSIS — L03119 Cellulitis of unspecified part of limb: Secondary | ICD-10-CM

## 2015-01-02 NOTE — Progress Notes (Signed)
   Subjective:    Patient ID: Patty Garner, female    DOB: 1980/09/14, 34 y.o.   MRN: [id number]  HPI Patient presents for wound check status post I&D of abscess of right thigh 2 days ago. Denies fever, drainage, erythema, or pain in the interim. Compliant with doxycycline and had isolated episode of nausea initially that resolved. Using warm compresses regularly. Changing dressing regularly.    Review of Systems  Constitutional: Negative for fever and chills.  Skin: Positive for wound. Negative for color change.       Objective:   Physical Exam  Constitutional: She is oriented to person, place, and time. She appears well-developed and well-nourished. No distress.  Blood pressure 110/70, pulse 100, temperature 98.4 F (36.9 C), temperature source Oral, resp. rate 16, height 5' 3.5" (1.613 m), weight 198 lb 12.8 oz (90.175 kg), SpO2 98 %.   HENT:  Head: Normocephalic and atraumatic.  Right Ear: External ear normal.  Left Ear: External ear normal.  Eyes: Conjunctivae are normal. Right eye exhibits no discharge. Left eye exhibits no discharge. No scleral icterus.  Pulmonary/Chest: Effort normal.  Neurological: She is alert and oriented to person, place, and time.  Skin: Skin is warm and dry. No rash noted. She is not diaphoretic. No erythema. No pallor.  Dressing and packing intact. Upon removal, no purulence expressed. Wound irrigated and small amount of necrotic tissue removed. 1/4 plain packing placed.   Psychiatric: She has a normal mood and affect. Her behavior is normal. Judgment and thought content normal.       Assessment & Plan:  1. Cellulitis and abscess of leg Wound healing well. Patient is going out of town in 2 days and will not be able to return for follow up. Wound healing well. Can pull packing on her own 01/05/15 and continue antibiotic. RTC if erythema, pain, or fever develop. Continue to keep wound dressed until completely closed.    Alveta Heimlich PA-C    [facility] 01/02/2015 11:14 AM

## 2015-01-03 LAB — WOUND CULTURE
Gram Stain: NONE SEEN
Organism ID, Bacteria: NO GROWTH

## 2015-01-09 NOTE — Progress Notes (Signed)
   Subjective:    Patient ID: Patty Garner, female    DOB: [birth year], 34 y.o.   MRN: [id number]  HPI Patient presents for painful abscess of right thigh. Has become more painful over past couple of days, but denies fever, chills, or drainage. Area of redness has not changed.  Has never had abscess before. Med allergy to tylenol with codeine.   Review of Systems  Constitutional: Negative for fever and chills.  Skin: Positive for color change.       Objective:   Physical Exam  Constitutional: She is oriented to person, place, and time. She appears well-developed and well-nourished. No distress.  Blood pressure 110/68, pulse 94, temperature 98.5 F (36.9 C), temperature source Oral, resp. rate 16, height 5' 3.5" (1.613 m), weight 194 lb 12.8 oz (88.361 kg), SpO2 99 %.   HENT:  Head: Normocephalic and atraumatic.  Right Ear: External ear normal.  Left Ear: External ear normal.  Eyes: Conjunctivae are normal. Right eye exhibits no discharge. Left eye exhibits no discharge. No scleral icterus.  Pulmonary/Chest: Effort normal.  Neurological: She is alert and oriented to person, place, and time.  Skin: Skin is warm and dry. No rash noted. She is not diaphoretic. There is erythema. No pallor.  Fluctuant, indurated abscess located on right upper thigh without drainage.  Psychiatric: She has a normal mood and affect. Her behavior is normal. Judgment and thought content normal.   Procedure Consent obtained. 1% lido local anesthesia. Incision made and purulence expressed. Culture obtained. Wound irrigated and explored. 1/4 plain packing placed. Clean dressing placed.   Results for orders placed or performed in visit on 12/31/14  Wound culture  Result Value Ref Range   Gram Stain Few    Gram Stain WBC present-both PMN and Mononuclear    Gram Stain No Squamous Epithelial Cells Seen    Gram Stain Few Gram Positive Cocci In Pairs In Clusters    Gram Stain Few Gram Negative Rods    Gram  Stain Rare Gram Positive Rods    Organism ID, Bacteria NO GROWTH 2 DAYS       Assessment & Plan:  1. Cellulitis and abscess of leg RTC 01/02/15 for wound care. Warm compresses 3-4x daily for 15 minutes. - doxycycline (VIBRAMYCIN) 100 MG capsule; Take 1 capsule (100 mg total) by mouth 2 (two) times daily.  Dispense: 20 capsule; Refill: 0 - fluconazole (DIFLUCAN) 150 MG tablet; Take 1 tablet (150 mg total) by mouth once. Repeat if needed  Dispense: 2 tablet; Refill: 0 - ibuprofen (ADVIL,MOTRIN) 600 MG tablet; Take 1 tablet (600 mg total) by mouth every 8 (eight) hours as needed.  Dispense: 30 tablet; Refill: 0 - Wound culture   Alveta Heimlich PA-C  [facility] 01/09/2015 12:16 PM

## 2015-01-10 ENCOUNTER — Ambulatory Visit (INDEPENDENT_AMBULATORY_CARE_PROVIDER_SITE_OTHER): Payer: BLUE CROSS/BLUE SHIELD | Admitting: Physician Assistant

## 2015-01-10 ENCOUNTER — Telehealth: Payer: Self-pay | Admitting: Physician Assistant

## 2015-01-10 VITALS — BP 122/76 | HR 98 | Temp 98.6°F | Resp 18 | Ht 63.25 in | Wt 197.6 lb

## 2015-01-10 DIAGNOSIS — L03119 Cellulitis of unspecified part of limb: Secondary | ICD-10-CM

## 2015-01-10 DIAGNOSIS — L02419 Cutaneous abscess of limb, unspecified: Secondary | ICD-10-CM

## 2015-01-10 NOTE — Progress Notes (Signed)
   Subjective:    Patient ID: Patty Garner, female    DOB: May 25, 1981, 34 y.o.   MRN: [id number]  HPI Patient presents for wound check status post I&D of abscess on right thigh. Pulled packing 5 days ago and wanted to make sure bump was not a reoccurrence. Denies pain, fever, drainage, or erythema. Still finishing up antibiotics and has been well tolerated.   Review of Systems  Constitutional: Negative.  Negative for fever and chills.  Skin: Positive for wound. Negative for color change.       Objective:   Physical Exam  Constitutional: She is oriented to person, place, and time. She appears well-developed and well-nourished. No distress.  Blood pressure 122/76, pulse 98, temperature 98.6 F (37 C), temperature source Oral, resp. rate 18, height 5' 3.25" (1.607 m), weight 197 lb 9.6 oz (89.631 kg), last menstrual period 12/20/2014, SpO2 99 %.   HENT:  Head: Normocephalic and atraumatic.  Right Ear: External ear normal.  Left Ear: External ear normal.  Eyes: Conjunctivae are normal. Right eye exhibits no discharge. Left eye exhibits no discharge. No scleral icterus.  Pulmonary/Chest: Effort normal.  Neurological: She is alert and oriented to person, place, and time.  Skin: Skin is warm and dry. No rash noted. She is not diaphoretic. No erythema. No pallor.  Wound completely healed. No erythema. No drainage. No fluctuance. No induration.   Psychiatric: She has a normal mood and affect. Her behavior is normal. Judgment and thought content normal.       Assessment & Plan:  1. Cellulitis and abscess of leg Healed no additional follow up needed. Complete antibiotic.    Alveta Heimlich PA-C  [facility] 01/10/2015 5:47 PM

## 2015-01-10 NOTE — Telephone Encounter (Signed)
Culture did not grow anything. Patient denies fever and pain, but thinks that there may be a lump near initial abscess. Can come in after work for additional evaluation.

## 2015-04-20 ENCOUNTER — Telehealth: Payer: Self-pay

## 2015-04-20 MED ORDER — LEVONORGESTREL-ETHINYL ESTRAD 0.1-20 MG-MCG PO TABS: 1.0000 | ORAL_TABLET | Freq: Every day | ORAL | Status: DC

## 2015-04-20 NOTE — Telephone Encounter (Signed)
Pt left v/m requesting to change Junel to a tier one med;ins approves Apri,Aviane, Cryselle and Junel FE; pt last annual exam was 06/07/14. [facility]. Pt said next week would be OK.

## 2015-04-20 NOTE — Telephone Encounter (Signed)
eRx sent

## 2015-05-23 ENCOUNTER — Ambulatory Visit (INDEPENDENT_AMBULATORY_CARE_PROVIDER_SITE_OTHER): Payer: 59 | Admitting: Physician Assistant

## 2015-05-23 VITALS — BP 130/80 | HR 94 | Temp 98.6°F | Resp 17 | Ht 63.0 in | Wt 193.0 lb

## 2015-05-23 DIAGNOSIS — Z8709 Personal history of other diseases of the respiratory system: Secondary | ICD-10-CM

## 2015-05-23 DIAGNOSIS — R05 Cough: Secondary | ICD-10-CM

## 2015-05-23 DIAGNOSIS — R070 Pain in throat: Secondary | ICD-10-CM | POA: Diagnosis not present

## 2015-05-23 DIAGNOSIS — B9789 Other viral agents as the cause of diseases classified elsewhere: Principal | ICD-10-CM

## 2015-05-23 DIAGNOSIS — J069 Acute upper respiratory infection, unspecified: Secondary | ICD-10-CM | POA: Diagnosis not present

## 2015-05-23 DIAGNOSIS — R059 Cough, unspecified: Secondary | ICD-10-CM

## 2015-05-23 LAB — POCT RAPID STREP A (OFFICE): Rapid Strep A Screen: NEGATIVE

## 2015-05-23 MED ORDER — HYDROCOD POLST-CPM POLST ER 10-8 MG/5ML PO SUER: 5.0000 mL | Freq: Every evening | ORAL | Status: AC | PRN

## 2015-05-23 MED ORDER — ALBUTEROL SULFATE HFA 108 (90 BASE) MCG/ACT IN AERS: 2.0000 | INHALATION_SPRAY | RESPIRATORY_TRACT | Status: DC | PRN

## 2015-05-23 NOTE — Progress Notes (Signed)
[facility] [address] [place] [address] [phone]  Date:  05/23/2015   Name:  Patty Garner   DOB:  20-May-1981   MRN:  [id number]  PCP:  Arnette Norris, MD    History of Present Illness:  Patty Garner is a 34 y.o. female patient who presents to [facility]   -yesterday, woke up with sore throat and headache and felt fatigued.  Head feels swimmy.  Chest pressure yesterday woke up with it, and secondary to coughing.   She is coughing productive yellow sputum.  No sob or dyspnea.  No dizziness.  Some nasal congestion, and pressure.  Maxillary sinus pain.  No thickened mucus but same color and consistency as sputum.  She has subjective fever.  One sick contact 4 days ago, with friend dxd with strep throat.       Patient Active Problem List   Diagnosis Date Noted  . Allergic rhinitis 10/26/2014  . Menorrhagia 08/23/2012  . 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  No past surgical history on file.  Social History  Substance Use Topics  . Smoking status: Former Research scientist (life sciences)  . Smokeless tobacco: None  . Alcohol Use: Yes     Comment: moderate    Family History  Problem Relation Age of Onset  . Hyperlipidemia Mother   . Diabetes Maternal Grandmother   . Heart disease Maternal Grandmother   . Hypertension Maternal Grandmother     Allergies  Allergen Reactions  . Tylenol With Codeine #3 [Acetaminophen-Codeine] Nausea And Vomiting    Medication list has been reviewed and updated.  Current Outpatient Prescriptions on File Prior to Visit  Medication Sig Dispense Refill  . cetirizine (ZYRTEC) 10 MG tablet Take 1 tablet (10 mg total) by mouth daily. 30 tablet 11  . fluticasone (FLONASE) 50 MCG/ACT nasal spray Place 2 sprays into both nostrils daily. 16 g 6  . ibuprofen (ADVIL,MOTRIN) 600 MG tablet Take 1 tablet (600 mg total) by mouth every 8 (eight) hours as needed. 30 tablet 0  . levonorgestrel-ethinyl estradiol  (AVIANE) 0.1-20 MG-MCG tablet Take 1 tablet by mouth daily. 1 Package 11  . Multiple Vitamin (MULTIVITAMIN) tablet Take 1 tablet by mouth daily.    . Multiple Vitamins-Minerals (HAIR/SKIN/NAILS PO) Take 2 capsules by mouth. Gummy     No current facility-administered medications on file prior to visit.    ROS ROS otherwise unremarkable unless listed above.   Physical Examination: BP 130/80 mmHg  Pulse 114  Temp(Src) 98.6 F (37 C) (Oral)  Resp 17  Ht 5\' 3"  (1.6 m)  Wt 193 lb (87.544 kg)  BMI 34.20 kg/m2  SpO2 99%  LMP 05/09/2015 Ideal Body Weight: Weight in (lb) to have BMI = 25: 140.8  Physical Exam  Constitutional: She is oriented to person, place, and time. She appears well-developed and well-nourished. No distress.  HENT:  Head: Normocephalic and atraumatic.  Right Ear: Tympanic membrane, external ear and ear canal normal.  Left Ear: Tympanic membrane, external ear and ear canal normal.  Nose: Mucosal edema present. No rhinorrhea.  Mouth/Throat: No uvula swelling. Posterior oropharyngeal erythema (mild) present. No oropharyngeal exudate or posterior oropharyngeal edema.  Eyes: Conjunctivae and EOM are normal. Pupils are equal, round, and reactive to light.  Cardiovascular: Normal rate.   Pulmonary/Chest: Effort normal. No accessory muscle usage. No respiratory distress. She has no decreased breath sounds (lungs circulating air thorughout). She has no wheezes. She has  no rhonchi.  Lymphadenopathy:       Head (right side): No submandibular and no tonsillar adenopathy present.       Head (left side): No submandibular and no tonsillar adenopathy present.    She has no cervical adenopathy.    She has no axillary adenopathy.  Neurological: She is alert and oriented to person, place, and time.  Skin: She is not diaphoretic.  Psychiatric: She has a normal mood and affect. Her behavior is normal.    Results for orders placed or performed in visit on 05/23/15  POCT rapid strep A   Result Value Ref Range   Rapid Strep A Screen Negative Negative    Assessment and Plan: Patty Garner is a 34 y.o. female who is here today for cough that started yesterday and sore throat.   Will treat supportively at this time.  Contact by phone if sxs do not improve.  Return with sob, or dyspnea.  Alarming sxs discussed.  Breath sounds are normal.  I am refilling her albuterol at this time.   Advised otc mucinex Throat pain - Plan: POCT rapid strep A, Throat culture ([facility])  Cough - Plan: chlorpheniramine-HYDROcodone (TUSSIONEX PENNKINETIC ER) 10-8 MG/5ML SUER, albuterol (PROVENTIL HFA;VENTOLIN HFA) 108 (90 BASE) MCG/ACT inhaler  Hx of extrinsic asthma - Plan: albuterol (PROVENTIL HFA;VENTOLIN HFA) 108 (90 BASE) MCG/ACT inhaler   Ivar Drape, PA-C [facility] 05/23/2015 10:42 AM

## 2015-05-23 NOTE — Patient Instructions (Signed)
Upper Respiratory Infection, Adult Most upper respiratory infections (URIs) are a viral infection of the air passages leading to the lungs. A URI affects the nose, throat, and upper air passages. The most common type of URI is nasopharyngitis and is typically referred to as "the common cold." URIs run their course and usually go away on their own. Most of the time, a URI does not require medical attention, but sometimes a bacterial infection in the upper airways can follow a viral infection. This is called a secondary infection. Sinus and middle ear infections are common types of secondary upper respiratory infections. Bacterial pneumonia can also complicate a URI. A URI can worsen asthma and chronic obstructive pulmonary disease (COPD). Sometimes, these complications can require emergency medical care and may be life threatening.  CAUSES Almost all URIs are caused by viruses. A virus is a type of germ and can spread from one person to another.  RISKS FACTORS You may be at risk for a URI if:   You smoke.   You have chronic heart or lung disease.  You have a weakened defense (immune) system.   You are very young or very old.   You have nasal allergies or asthma.  You work in crowded or poorly ventilated areas.  You work in health care facilities or schools. SIGNS AND SYMPTOMS  Symptoms typically develop 2-3 days after you come in contact with a cold virus. Most viral URIs last 7-10 days. However, viral URIs from the influenza virus (flu virus) can last 14-18 days and are typically more severe. Symptoms may include:   Runny or stuffy (congested) nose.   Sneezing.   Cough.   Sore throat.   Headache.   Fatigue.   Fever.   Loss of appetite.   Pain in your forehead, behind your eyes, and over your cheekbones (sinus pain).  Muscle aches.  DIAGNOSIS  Your health care provider may diagnose a URI by:  Physical exam.  Tests to check that your symptoms are not due to  another condition such as:  Strep throat.  Sinusitis.  Pneumonia.  Asthma. TREATMENT  A URI goes away on its own with time. It cannot be cured with medicines, but medicines may be prescribed or recommended to relieve symptoms. Medicines may help:  Reduce your fever.  Reduce your cough.  Relieve nasal congestion. HOME CARE INSTRUCTIONS   Take medicines only as directed by your health care provider.   Gargle warm saltwater or take cough drops to comfort your throat as directed by your health care provider.  Use a warm mist humidifier or inhale steam from a shower to increase air moisture. This may make it easier to breathe.  Drink enough fluid to keep your urine clear or pale yellow.   Eat soups and other clear broths and maintain good nutrition.   Rest as needed.   Return to work when your temperature has returned to normal or as your health care provider advises. You may need to stay home longer to avoid infecting others. You can also use a face mask and careful hand washing to prevent spread of the virus.  Increase the usage of your inhaler if you have asthma.   Do not use any tobacco products, including cigarettes, chewing tobacco, or electronic cigarettes. If you need help quitting, ask your health care provider. PREVENTION  The best way to protect yourself from getting a cold is to practice good hygiene.   Avoid oral or hand contact with people with cold   symptoms.   Wash your hands often if contact occurs.  There is no clear evidence that vitamin C, vitamin E, echinacea, or exercise reduces the chance of developing a cold. However, it is always recommended to get plenty of rest, exercise, and practice good nutrition.  SEEK MEDICAL CARE IF:   You are getting worse rather than better.   Your symptoms are not controlled by medicine.   You have chills.  You have worsening shortness of breath.  You have brown or red mucus.  You have yellow or brown nasal  discharge.  You have pain in your face, especially when you bend forward.  You have a fever.  You have swollen neck glands.  You have pain while swallowing.  You have white areas in the back of your throat. SEEK IMMEDIATE MEDICAL CARE IF:   You have severe or persistent:  Headache.  Ear pain.  Sinus pain.  Chest pain.  You have chronic lung disease and any of the following:  Wheezing.  Prolonged cough.  Coughing up blood.  A change in your usual mucus.  You have a stiff neck.  You have changes in your:  Vision.  Hearing.  Thinking.  Mood. MAKE SURE YOU:   Understand these instructions.  Will watch your condition.  Will get help right away if you are not doing well or get worse.   This information is not intended to replace advice given to you by your health care provider. Make sure you discuss any questions you have with your health care provider.   Document Released: 12/31/2000 Document Revised: 11/21/2014 Document Reviewed: 10/12/2013 Elsevier Interactive Patient Education 2016 Elsevier Inc.  

## 2015-05-24 LAB — CULTURE, GROUP A STREP: Organism ID, Bacteria: NORMAL

## 2015-07-12 ENCOUNTER — Other Ambulatory Visit: Payer: Self-pay | Admitting: Family Medicine

## 2015-07-12 NOTE — Telephone Encounter (Signed)
See note from pharmacy.

## 2015-07-30 ENCOUNTER — Ambulatory Visit (INDEPENDENT_AMBULATORY_CARE_PROVIDER_SITE_OTHER): Payer: 59 | Admitting: Emergency Medicine

## 2015-07-30 VITALS — BP 122/78 | HR 70 | Temp 99.0°F | Resp 17 | Ht 64.5 in | Wt 203.0 lb

## 2015-07-30 DIAGNOSIS — H109 Unspecified conjunctivitis: Secondary | ICD-10-CM

## 2015-07-30 MED ORDER — POLYMYXIN B-TRIMETHOPRIM 10000-0.1 UNIT/ML-% OP SOLN: 2.0000 [drp] | OPHTHALMIC | Status: DC

## 2015-07-30 NOTE — Patient Instructions (Signed)

## 2015-07-30 NOTE — Progress Notes (Signed)
Subjective:  Patient ID: Patty Garner, female    DOB: [birth year]  Age: 35 y.o. MRN: [id number]  CC: Conjunctivitis   HPI Patty Garner presents  patients used a number of over-the-counter eyedrops over the last several days for treatment of a red itchy eye. She has gluing and drainage from her eye. No history of injury. No history of foreign body. She has no visual symptoms. He has no pain in her eye. Has no nasal congestion postnasal drainage or other complaint  History Thella has no past medical history on file.   She has no past surgical history on file.   Her  family history includes Diabetes in her maternal grandmother; Heart disease in her maternal grandmother; Hyperlipidemia in her mother; Hypertension in her maternal grandmother.  She   reports that she has quit smoking. She does not have any smokeless tobacco history on file. She reports that she drinks alcohol. She reports that she does not use illicit drugs.  Outpatient Prescriptions Prior to Visit  Medication Sig Dispense Refill  . cetirizine (ZYRTEC) 10 MG tablet Take 1 tablet (10 mg total) by mouth daily. 30 tablet 11  . ibuprofen (ADVIL,MOTRIN) 600 MG tablet Take 1 tablet (600 mg total) by mouth every 8 (eight) hours as needed. 30 tablet 0  . JUNEL 1.5/30 1.5-30 MG-MCG tablet TAKE 1 TABLET BY MOUTH EVERY DAY 21 tablet 10  . Multiple Vitamin (MULTIVITAMIN) tablet Take 1 tablet by mouth daily.    Marland Kitchen albuterol (PROVENTIL HFA;VENTOLIN HFA) 108 (90 BASE) MCG/ACT inhaler Inhale 2 puffs into the lungs every 4 (four) hours as needed for wheezing or shortness of breath (cough, shortness of breath or wheezing.). (Patient not taking: Reported on 07/30/2015) 1 Inhaler 1  . fluticasone (FLONASE) 50 MCG/ACT nasal spray Place 2 sprays into both nostrils daily. 16 g 6  . levonorgestrel-ethinyl estradiol (AVIANE) 0.1-20 MG-MCG tablet Take 1 tablet by mouth daily. 1 Package 11  . Multiple Vitamins-Minerals (HAIR/SKIN/NAILS PO) Take 2  capsules by mouth. Gummy     No facility-administered medications prior to visit.    Social History   Social History  . Marital Status: Single    Spouse Name: N/A  . Number of Children: N/A  . Years of Education: N/A   Social History Main Topics  . Smoking status: Former Research scientist (life sciences)  . Smokeless tobacco: None  . Alcohol Use: Yes     Comment: moderate  . Drug Use: No  . Sexual Activity: Not Asked   Other Topics Concern  . None   Social History Narrative     Review of Systems  Constitutional: Negative for fever, chills and appetite change.  HENT: Negative for congestion, ear pain, postnasal drip, sinus pressure and sore throat.   Eyes: Positive for discharge, redness and itching. Negative for pain.  Respiratory: Negative for cough, shortness of breath and wheezing.   Cardiovascular: Negative for leg swelling.  Gastrointestinal: Negative for nausea, vomiting, abdominal pain, diarrhea, constipation and blood in stool.  Endocrine: Negative for polyuria.  Genitourinary: Negative for dysuria, urgency, frequency and flank pain.  Musculoskeletal: Negative for gait problem.  Skin: Negative for rash.  Neurological: Negative for weakness and headaches.  Psychiatric/Behavioral: Negative for confusion and decreased concentration. The patient is not nervous/anxious.     Objective:  BP 122/78 mmHg  Pulse 70  Temp(Src) 99 F (37.2 C) (Oral)  Resp 17  Ht 5' 4.5" (1.638 m)  Wt 203 lb (92.08 kg)  BMI 34.32 kg/m2  SpO2  98%  LMP 07/20/2015  Physical Exam  Constitutional: She is oriented to person, place, and time. She appears well-developed and well-nourished.  HENT:  Head: Normocephalic and atraumatic.  Eyes: Pupils are equal, round, and reactive to light. Lids are everted and swept, no foreign bodies found. Right eye exhibits discharge. Right conjunctiva is injected.  Pulmonary/Chest: Effort normal.  Musculoskeletal: She exhibits no edema.  Neurological: She is alert and oriented  to person, place, and time.  Skin: Skin is dry.  Psychiatric: She has a normal mood and affect. Her behavior is normal. Thought content normal.      Assessment & Plan:   Munni was seen today for conjunctivitis.  Diagnoses and all orders for this visit:  Conjunctivitis of right eye  Other orders -     trimethoprim-polymyxin b (POLYTRIM) ophthalmic solution; Place 2 drops into the right eye every 4 (four) hours.  I have discontinued Ms. Peyton's Multiple Vitamins-Minerals (HAIR/SKIN/NAILS PO), fluticasone, and levonorgestrel-ethinyl estradiol. I am also having her start on trimethoprim-polymyxin b. Additionally, I am having her maintain her multivitamin, cetirizine, ibuprofen, albuterol, and JUNEL 1.5/30.  Meds ordered this encounter  Medications  . trimethoprim-polymyxin b (POLYTRIM) ophthalmic solution    Sig: Place 2 drops into the right eye every 4 (four) hours.    Dispense:  10 mL    Refill:  0    Appropriate red flag conditions were discussed with the patient as well as actions that should be taken.  Patient expressed his understanding.  Follow-up: Return if symptoms worsen or fail to improve.  Roselee Culver, MD

## 2015-08-28 ENCOUNTER — Ambulatory Visit (INDEPENDENT_AMBULATORY_CARE_PROVIDER_SITE_OTHER): Payer: 59 | Admitting: Family Medicine

## 2015-08-28 ENCOUNTER — Encounter: Payer: Self-pay | Admitting: Family Medicine

## 2015-08-28 ENCOUNTER — Other Ambulatory Visit (HOSPITAL_COMMUNITY)
Admission: RE | Admit: 2015-08-28 | Discharge: 2015-08-28 | Disposition: A | Discharge status: Home / Self Care | Payer: 59 | Source: Ambulatory Visit | Attending: Family Medicine | Admitting: Family Medicine

## 2015-08-28 VITALS — BP 122/78 | HR 78 | Temp 98.0°F | Ht 62.5 in | Wt 196.5 lb

## 2015-08-28 DIAGNOSIS — Z0000 Encounter for general adult medical examination without abnormal findings: Secondary | ICD-10-CM | POA: Diagnosis not present

## 2015-08-28 DIAGNOSIS — Z01419 Encounter for gynecological examination (general) (routine) without abnormal findings: Secondary | ICD-10-CM | POA: Insufficient documentation

## 2015-08-28 DIAGNOSIS — Z1151 Encounter for screening for human papillomavirus (HPV): Secondary | ICD-10-CM | POA: Insufficient documentation

## 2015-08-28 DIAGNOSIS — E669 Obesity, unspecified: Secondary | ICD-10-CM

## 2015-08-28 LAB — LIPID PANEL
CHOL/HDL RATIO: 5
CHOLESTEROL: 258 mg/dL — AB (ref 0–200)
HDL: 56.7 mg/dL (ref >39.00)
LDL CALC: 180 mg/dL — AB (ref 0–99)
NonHDL: 200.98
TRIGLYCERIDES: 105 mg/dL (ref 0.0–149.0)
VLDL: 21 mg/dL (ref 0.0–40.0)

## 2015-08-28 LAB — COMPREHENSIVE METABOLIC PANEL
ALT: 34 U/L (ref 0–35)
AST: 23 U/L (ref 0–37)
Albumin: 4.3 g/dL (ref 3.5–5.2)
Alkaline Phosphatase: 44 U/L (ref 39–117)
BILIRUBIN TOTAL: 0.4 mg/dL (ref 0.2–1.2)
BUN: 8 mg/dL (ref 6–23)
CHLORIDE: 103 meq/L (ref 96–112)
CO2: 26 meq/L (ref 19–32)
CREATININE: 0.63 mg/dL (ref 0.40–1.20)
Calcium: 9.3 mg/dL (ref 8.4–10.5)
GFR: 136.7 mL/min (ref >60.00)
GLUCOSE: 91 mg/dL (ref 70–99)
Potassium: 3.7 mEq/L (ref 3.5–5.1)
SODIUM: 135 meq/L (ref 135–145)
Total Protein: 7.3 g/dL (ref 6.0–8.3)

## 2015-08-28 LAB — CBC WITH DIFFERENTIAL/PLATELET
BASOS ABS: 0 10*3/uL (ref 0.0–0.1)
BASOS PCT: 0.5 % (ref 0.0–3.0)
EOS ABS: 0.2 10*3/uL (ref 0.0–0.7)
Eosinophils Relative: 2.6 % (ref 0.0–5.0)
HCT: 38.5 % (ref 36.0–46.0)
Hemoglobin: 12.9 g/dL (ref 12.0–15.0)
LYMPHS ABS: 2.4 10*3/uL (ref 0.7–4.0)
LYMPHS PCT: 37.7 % (ref 12.0–46.0)
MCHC: 33.4 g/dL (ref 30.0–36.0)
MCV: 87.1 fl (ref 78.0–100.0)
MONO ABS: 0.6 10*3/uL (ref 0.1–1.0)
Monocytes Relative: 9 % (ref 3.0–12.0)
NEUTROS ABS: 3.2 10*3/uL (ref 1.4–7.7)
NEUTROS PCT: 50.2 % (ref 43.0–77.0)
PLATELETS: 335 10*3/uL (ref 150.0–400.0)
RBC: 4.42 Mil/uL (ref 3.87–5.11)
RDW: 12.9 % (ref 11.5–15.5)
WBC: 6.4 10*3/uL (ref 4.0–10.5)

## 2015-08-28 LAB — TSH: TSH: 0.53 u[IU]/mL (ref 0.35–4.50)

## 2015-08-28 MED ORDER — PHENTERMINE HCL 15 MG PO CAPS: 15.0000 mg | ORAL_CAPSULE | ORAL | Status: DC

## 2015-08-28 NOTE — Assessment & Plan Note (Signed)
Reviewed preventive care protocols, scheduled due services, and updated immunizations Discussed nutrition, exercise, diet, and healthy lifestyle.  Pap smear today.  Labs today.  Orders Placed This Encounter  Procedures  . CBC with Differential/Platelet  . Comprehensive metabolic panel  . Lipid panel  . TSH

## 2015-08-28 NOTE — Progress Notes (Addendum)
Subjective:    Patient ID: Patty Garner, female    DOB: Nov 08, 1980, 35 y.o.   MRN: [id number]  HPI  34 yo pleasant female here for CPX.  G0- no h/o abnormal pap smears. Last pap smear was done by me on 11/24/11.  Sexually active with females only.  She is taking Junel for OCP/menstrual regulation.  She is asking for "diet pills."  Has tried several diets and now exercising.  Cant seem to lose weight. Wt Readings from Last 3 Encounters:  08/28/15 196 lb 8 oz (89.132 kg)  07/30/15 203 lb (92.08 kg)  05/23/15 193 lb (87.544 kg)     Lab Results  Component Value Date   WBC 5.8 06/07/2014   HGB 12.4 06/07/2014   HCT 37.5 06/07/2014   MCV 86.9 06/07/2014   PLT 297.0 06/07/2014   Lab Results  Component Value Date   NA 137 06/07/2014   K 3.9 06/07/2014   CL 105 06/07/2014   CO2 23 06/07/2014   Lab Results  Component Value Date   CHOL 245* 06/07/2014   HDL 62.00 06/07/2014   LDLCALC 159* 06/07/2014   LDLDIRECT 160.9 11/24/2011   TRIG 118.0 06/07/2014   CHOLHDL 4 06/07/2014   Lab Results  Component Value Date   TSH 0.59 06/07/2014     Patient Active Problem List   Diagnosis Date Noted  . Well woman exam 08/28/2015  . Allergic rhinitis 10/26/2014  . Menorrhagia 08/23/2012   No past medical history on file. No past surgical history on file. Social History  Substance Use Topics  . Smoking status: Former Research scientist (life sciences)  . Smokeless tobacco: None  . Alcohol Use: Yes     Comment: moderate   Family History  Problem Relation Age of Onset  . Hyperlipidemia Mother   . Diabetes Maternal Grandmother   . Heart disease Maternal Grandmother   . Hypertension Maternal Grandmother    Allergies  Allergen Reactions  . Tylenol With Codeine #3 [Acetaminophen-Codeine] Nausea And Vomiting   Current Outpatient Prescriptions on File Prior to Visit  Medication Sig Dispense Refill  . cetirizine (ZYRTEC) 10 MG tablet Take 1 tablet (10 mg total) by mouth daily. 30 tablet 11  .  ibuprofen (ADVIL,MOTRIN) 600 MG tablet Take 1 tablet (600 mg total) by mouth every 8 (eight) hours as needed. 30 tablet 0  . JUNEL 1.5/30 1.5-30 MG-MCG tablet TAKE 1 TABLET BY MOUTH EVERY DAY 21 tablet 10  . Multiple Vitamin (MULTIVITAMIN) tablet Take 1 tablet by mouth daily.     No current facility-administered medications on file prior to visit.   The PMH, PSH, Social History, Family History, Medications, and allergies have been reviewed in [facility], and have been updated if relevant.    Review of Systems  Constitutional: Negative.   HENT: Negative.   Eyes: Negative.   Respiratory: Negative.   Cardiovascular: Negative.   Gastrointestinal: Negative.   Endocrine: Negative.   Genitourinary: Negative.   Musculoskeletal: Negative.   Skin: Negative.   Allergic/Immunologic: Negative.   Neurological: Negative.   Hematological: Negative.   Psychiatric/Behavioral: Negative.   All other systems reviewed and are negative.       Objective:   Physical Exam BP 122/78 mmHg  Pulse 78  Temp(Src) 98 F (36.7 C) (Oral)  Ht 5' 2.5" (1.588 m)  Wt 196 lb 8 oz (89.132 kg)  BMI 35.35 kg/m2  SpO2 98%  LMP 08/20/2015 (Approximate)  General:  Well-developed,well-nourished,in no acute distress; alert,appropriate and cooperative throughout examination Head:  normocephalic  and atraumatic.   Eyes:  vision grossly intact, pupils equal, pupils round, and pupils reactive to light.   Ears:  R ear normal and L ear normal.   Nose:  no external deformity.   Mouth:  good dentition.   Neck:  No deformities, masses, or tenderness noted. Breasts:  No mass, nodules, thickening, tenderness, bulging, retraction, inflamation, nipple discharge or skin changes noted.   Lungs:  Normal respiratory effort, chest expands symmetrically. Lungs are clear to auscultation, no crackles or wheezes. Heart:  Normal rate and regular rhythm. S1 and S2 normal without gallop, murmur, click, rub or other extra sounds. Abdomen:  Bowel  sounds positive,abdomen soft and non-tender without masses, organomegaly or hernias noted. Rectal:  no external abnormalities.   Genitalia:  Pelvic Exam:        External: normal female genitalia without lesions or masses        Vagina: normal without lesions or masses        Cervix: normal without lesions or masses        Adnexa: normal bimanual exam without masses or fullness        Uterus: normal by palpation        Pap smear: performed Msk:  No deformity or scoliosis noted of thoracic or lumbar spine Extremities:  No clubbing, cyanosis, edema, or deformity noted with normal full range of motion of all joints.   Neurologic:  alert & oriented X3 and gait normal.   Skin:  Intact without suspicious lesions or rashes Cervical Nodes:  No lymphadenopathy noted Axillary Nodes:  No palpable lymphadenopathy Psych:  Cognition and judgment appear intact. Alert and cooperative with normal attention span and concentration. No apparent delusions, illusions, hallucinations        Assessment & Plan:

## 2015-08-28 NOTE — Addendum Note (Signed)
Addended by: Marchia Bond on: 08/28/2015 02:20 PM   Modules accepted: Miquel Dunn

## 2015-08-28 NOTE — Addendum Note (Signed)
Addended by: Lucille Passy on: 08/28/2015 01:36 PM   Modules accepted: Orders, SmartSet

## 2015-08-28 NOTE — Assessment & Plan Note (Signed)
Discussed weight loss plan.  Pt would also like to discuss medication options- discussed phentermine risk benefits, side effects including HTN, pulmonary HTN, stroke.    She would like to start phentermine and lifestyle changes.  Follow up in 1 month.  If BMI < 27 will decrease to half dose x 1 month then stop   

## 2015-08-28 NOTE — Progress Notes (Signed)
Pre visit review using our clinic review tool, if applicable. No additional management support is needed unless otherwise documented below in the visit note. 

## 2015-08-28 NOTE — Patient Instructions (Signed)
Good to see you  Please come see me in 1 month.

## 2015-08-28 NOTE — Addendum Note (Signed)
Addended by: Lucille Passy on: 08/28/2015 01:40 PM   Modules accepted: Miquel Dunn

## 2015-08-28 NOTE — Addendum Note (Signed)
Addended by: Modena Nunnery on: 08/28/2015 01:34 PM   Modules accepted: Orders

## 2015-08-29 ENCOUNTER — Other Ambulatory Visit: Payer: Self-pay | Admitting: Family Medicine

## 2015-08-29 ENCOUNTER — Telehealth: Payer: Self-pay | Admitting: Family Medicine

## 2015-08-29 LAB — CYTOLOGY - PAP

## 2015-08-29 MED ORDER — SIMVASTATIN 10 MG PO TABS: 10.0000 mg | ORAL_TABLET | Freq: Every day | ORAL | Status: DC

## 2015-08-29 NOTE — Telephone Encounter (Signed)
Spoke to pt and confirmed meds were sent to requested pharmacy. Offered lipid lowering information and pt is wanting; mailed as requested

## 2015-08-29 NOTE — Telephone Encounter (Signed)
Pt called wanting waynetta to call her back She has question about rx you spoke to her about

## 2015-08-30 ENCOUNTER — Encounter: Payer: Self-pay | Admitting: *Deleted

## 2015-09-26 ENCOUNTER — Ambulatory Visit (INDEPENDENT_AMBULATORY_CARE_PROVIDER_SITE_OTHER): Payer: 59 | Admitting: Family Medicine

## 2015-09-26 VITALS — BP 116/84 | HR 92 | Temp 97.5°F | Wt 194.5 lb

## 2015-09-26 DIAGNOSIS — E669 Obesity, unspecified: Secondary | ICD-10-CM

## 2015-09-26 MED ORDER — PHENTERMINE HCL 30 MG PO CAPS: 30.0000 mg | ORAL_CAPSULE | ORAL | Status: DC

## 2015-09-26 NOTE — Patient Instructions (Signed)
Great to see you. We are increasing your phentermine to 30 mg daily. Please come see me in 1 month.

## 2015-09-26 NOTE — Assessment & Plan Note (Signed)
Improving. She is aware of risks of phentermine, currently tolerating it well. She would like to try increased dose. Rx for 30 mg phentermine printed and given to pt. Follow up in 1 month.

## 2015-09-26 NOTE — Progress Notes (Signed)
Pre visit review using our clinic review tool, if applicable. No additional management support is needed unless otherwise documented below in the visit note. 

## 2015-09-26 NOTE — Progress Notes (Signed)
Subjective:   Patient ID: Patty Garner, female    DOB: 03/15/1981, 35 y.o.   MRN: [id number]  Patty Garner is a pleasant 35 y.o. year old female who presents to clinic today with Follow-up  on 09/26/2015  HPI:  Obesity- here for 1 month follow up after starting phentermine 15 mg daily. Denies any CP, SOB, HA or blurred vision. No palpitations. Sleeping ok.  Does feel it is suppressing her appetite.  Trying to be more active.  Wt Readings from Last 3 Encounters:  09/26/15 194 lb 8 oz (88.225 kg)  08/28/15 196 lb 8 oz (89.132 kg)  07/30/15 203 lb (92.08 kg)    Current Outpatient Prescriptions on File Prior to Visit  Medication Sig Dispense Refill  . cetirizine (ZYRTEC) 10 MG tablet Take 1 tablet (10 mg total) by mouth daily. 30 tablet 11  . ibuprofen (ADVIL,MOTRIN) 600 MG tablet Take 1 tablet (600 mg total) by mouth every 8 (eight) hours as needed. 30 tablet 0  . JUNEL 1.5/30 1.5-30 MG-MCG tablet TAKE 1 TABLET BY MOUTH EVERY DAY 21 tablet 10  . Multiple Vitamin (MULTIVITAMIN) tablet Take 1 tablet by mouth daily.    . simvastatin (ZOCOR) 10 MG tablet Take 1 tablet (10 mg total) by mouth at bedtime. 90 tablet 3   No current facility-administered medications on file prior to visit.    Allergies  Allergen Reactions  . Tylenol With Codeine #3 [Acetaminophen-Codeine] Nausea And Vomiting    No past medical history on file.  No past surgical history on file.  Family History  Problem Relation Age of Onset  . Hyperlipidemia Mother   . Diabetes Maternal Grandmother   . Heart disease Maternal Grandmother   . Hypertension Maternal Grandmother     Social History   Social History  . Marital Status: Single    Spouse Name: N/A  . Number of Children: N/A  . Years of Education: N/A   Occupational History  . Not on file.   Social History Main Topics  . Smoking status: Former Research scientist (life sciences)  . Smokeless tobacco: Not on file  . Alcohol Use: Yes     Comment: moderate  . Drug Use: No   . Sexual Activity: Not on file   Other Topics Concern  . Not on file   Social History Narrative   The PMH, PSH, Social History, Family History, Medications, and allergies have been reviewed in [facility], and have been updated if relevant.   Review of Systems  Constitutional: Negative.   Eyes: Negative.   Respiratory: Negative.   Cardiovascular: Negative.   Gastrointestinal: Negative.   Musculoskeletal: Negative.   Skin: Negative.   Neurological: Negative.   Psychiatric/Behavioral: Negative.   All other systems reviewed and are negative.      Objective:    BP 116/84 mmHg  Pulse 92  Temp(Src) 97.5 F (36.4 C) (Oral)  Wt 194 lb 8 oz (88.225 kg)  SpO2 98%  LMP 08/20/2015 (Approximate)   Physical Exam  Constitutional: She is oriented to person, place, and time. She appears well-developed and well-nourished. No distress.  HENT:  Head: Normocephalic.  Eyes: Conjunctivae are normal.  Cardiovascular: Normal rate.   Pulmonary/Chest: Effort normal.  Musculoskeletal: Normal range of motion.  Neurological: She is alert and oriented to person, place, and time. No cranial nerve deficit.  Skin: Skin is warm and dry. She is not diaphoretic.  Psychiatric: She has a normal mood and affect. Her behavior is normal. Judgment and thought content normal.  Nursing note and vitals reviewed.         Assessment & Plan:   Obesity No Follow-up on file.

## 2015-10-29 ENCOUNTER — Ambulatory Visit (INDEPENDENT_AMBULATORY_CARE_PROVIDER_SITE_OTHER): Payer: 59 | Admitting: Family Medicine

## 2015-10-29 ENCOUNTER — Encounter: Payer: Self-pay | Admitting: Family Medicine

## 2015-10-29 VITALS — BP 120/70 | HR 100 | Temp 98.3°F | Wt 190.8 lb

## 2015-10-29 DIAGNOSIS — E669 Obesity, unspecified: Secondary | ICD-10-CM | POA: Diagnosis not present

## 2015-10-29 MED ORDER — PHENTERMINE HCL 30 MG PO CAPS: 30.0000 mg | ORAL_CAPSULE | ORAL | Status: DC

## 2015-10-29 NOTE — Progress Notes (Signed)
Subjective:   Patient ID: Patty Garner, female    DOB: 01-14-1981, 35 y.o.   MRN: [id number]  LOREAL VALENTE is a pleasant 35 y.o. year old female who presents to clinic today with Follow-up and Medication Refill  on 10/29/2015  HPI:  Obesity- here for 1 month follow up after increasing phentermine to 30 mg daily. Denies any CP, SOB, HA or blurred vision. No palpitations. Sleeping ok.  Does feel it is suppressing her appetite.  Trying to be more active. Wants to continue current dose.  Wt Readings from Last 3 Encounters:  10/29/15 190 lb 12.8 oz (86.546 kg)  09/26/15 194 lb 8 oz (88.225 kg)  08/28/15 196 lb 8 oz (89.132 kg)    Current Outpatient Prescriptions on File Prior to Visit  Medication Sig Dispense Refill  . cetirizine (ZYRTEC) 10 MG tablet Take 1 tablet (10 mg total) by mouth daily. 30 tablet 11  . ibuprofen (ADVIL,MOTRIN) 600 MG tablet Take 1 tablet (600 mg total) by mouth every 8 (eight) hours as needed. 30 tablet 0  . JUNEL 1.5/30 1.5-30 MG-MCG tablet TAKE 1 TABLET BY MOUTH EVERY DAY 21 tablet 10  . Multiple Vitamin (MULTIVITAMIN) tablet Take 1 tablet by mouth daily.    . simvastatin (ZOCOR) 10 MG tablet Take 1 tablet (10 mg total) by mouth at bedtime. 90 tablet 3   No current facility-administered medications on file prior to visit.    Allergies  Allergen Reactions  . Tylenol With Codeine #3 [Acetaminophen-Codeine] Nausea And Vomiting    No past medical history on file.  No past surgical history on file.  Family History  Problem Relation Age of Onset  . Hyperlipidemia Mother   . Diabetes Maternal Grandmother   . Heart disease Maternal Grandmother   . Hypertension Maternal Grandmother     Social History   Social History  . Marital Status: Single    Spouse Name: N/A  . Number of Children: N/A  . Years of Education: N/A   Occupational History  . Not on file.   Social History Main Topics  . Smoking status: Former Research scientist (life sciences)  . Smokeless tobacco:  Not on file  . Alcohol Use: Yes     Comment: moderate  . Drug Use: No  . Sexual Activity: Not on file   Other Topics Concern  . Not on file   Social History Narrative   The PMH, PSH, Social History, Family History, Medications, and allergies have been reviewed in [facility], and have been updated if relevant.   Review of Systems  Constitutional: Negative.   Eyes: Negative.   Respiratory: Negative.   Cardiovascular: Negative.   Gastrointestinal: Negative.   Musculoskeletal: Negative.   Skin: Negative.   Neurological: Negative.   Psychiatric/Behavioral: Negative.   All other systems reviewed and are negative.      Objective:    BP 120/70 mmHg  Pulse 100  Temp(Src) 98.3 F (36.8 C)  Wt 190 lb 12.8 oz (86.546 kg)  LMP 10/18/2015   Physical Exam  Constitutional: She is oriented to person, place, and time. She appears well-developed and well-nourished. No distress.  HENT:  Head: Normocephalic.  Eyes: Conjunctivae are normal.  Cardiovascular: Normal rate.   Pulmonary/Chest: Effort normal.  Musculoskeletal: Normal range of motion.  Neurological: She is alert and oriented to person, place, and time. No cranial nerve deficit.  Skin: Skin is warm and dry. She is not diaphoretic.  Psychiatric: She has a normal mood and affect. Her behavior is  normal. Judgment and thought content normal.  Nursing note and vitals reviewed.         Assessment & Plan:   Obesity No Follow-up on file.

## 2015-10-29 NOTE — Assessment & Plan Note (Signed)
Well controlled on current dose of phentermine. Rx printed with 2 additional refills. Follow up in 6 months. The patient indicates understanding of these issues and agrees with the plan.

## 2016-05-05 ENCOUNTER — Other Ambulatory Visit: Payer: Self-pay | Admitting: Family Medicine

## 2016-06-02 ENCOUNTER — Ambulatory Visit (INDEPENDENT_AMBULATORY_CARE_PROVIDER_SITE_OTHER): Payer: 59 | Admitting: Family Medicine

## 2016-06-02 ENCOUNTER — Other Ambulatory Visit: Payer: Self-pay | Admitting: Family Medicine

## 2016-06-02 ENCOUNTER — Encounter: Payer: Self-pay | Admitting: Family Medicine

## 2016-06-02 DIAGNOSIS — E785 Hyperlipidemia, unspecified: Secondary | ICD-10-CM | POA: Diagnosis not present

## 2016-06-02 LAB — COMPREHENSIVE METABOLIC PANEL
ALK PHOS: 41 U/L (ref 39–117)
ALT: 34 U/L (ref 0–35)
AST: 22 U/L (ref 0–37)
Albumin: 4.3 g/dL (ref 3.5–5.2)
BUN: 10 mg/dL (ref 6–23)
CHLORIDE: 103 meq/L (ref 96–112)
CO2: 25 meq/L (ref 19–32)
Calcium: 9.3 mg/dL (ref 8.4–10.5)
Creatinine, Ser: 0.67 mg/dL (ref 0.40–1.20)
GFR: 126.8 mL/min (ref >60.00)
GLUCOSE: 100 mg/dL — AB (ref 70–99)
POTASSIUM: 4 meq/L (ref 3.5–5.1)
SODIUM: 136 meq/L (ref 135–145)
TOTAL PROTEIN: 7.2 g/dL (ref 6.0–8.3)
Total Bilirubin: 0.6 mg/dL (ref 0.2–1.2)

## 2016-06-02 LAB — LIPID PANEL
CHOL/HDL RATIO: 4
Cholesterol: 271 mg/dL — ABNORMAL HIGH (ref 0–200)
HDL: 64.1 mg/dL (ref >39.00)
LDL Cholesterol: 193 mg/dL — ABNORMAL HIGH (ref 0–99)
NONHDL: 206.4
Triglycerides: 68 mg/dL (ref 0.0–149.0)
VLDL: 13.6 mg/dL (ref 0.0–40.0)

## 2016-06-02 MED ORDER — PRAVASTATIN SODIUM 20 MG PO TABS: 20.0000 mg | ORAL_TABLET | Freq: Every day | ORAL | 3 refills | Status: DC

## 2016-06-02 NOTE — Patient Instructions (Signed)
Good to see you.  I will call you with your lab results.  I'm so sorry about your loss.

## 2016-06-02 NOTE — Progress Notes (Signed)
Pre visit review using our clinic review tool, if applicable. No additional management support is needed unless otherwise documented below in the visit note. 

## 2016-06-02 NOTE — Assessment & Plan Note (Signed)
Repeat lipid panel today. Congratulated her on diet changes and weight loss. If remains elevated, she agrees to restart rx. ? Trial of pravachol. The patient indicates understanding of these issues and agrees with the plan.  Orders Placed This Encounter  Procedures  . Lipid panel  . Comprehensive metabolic panel

## 2016-06-02 NOTE — Progress Notes (Signed)
Subjective:   Patient ID: Patty Garner, female    DOB: 02-15-1981, 35 y.o.   MRN: [id number]  Patty Garner is a pleasant 35 y.o. year old female who presents to clinic today with Follow-up (lipids)  on 06/02/2016  HPI:  HLD-  She stopped taking Zocor in August  Was causing muscle aches.  Then her grandmother died shortly after with heart disease.  She was very close to her and this motivated [place] to change her diet. She has cut out red meat, eating more oatmeal and less cheese.  Has lost weight.  Wants to recheck cholesterol and then decide about rx. Wt Readings from Last 3 Encounters:  06/02/16 183 lb 4 oz (83.1 kg)  10/29/15 190 lb 12.8 oz (86.5 kg)  09/26/15 194 lb 8 oz (88.2 kg)    Current Outpatient Prescriptions on File Prior to Visit  Medication Sig Dispense Refill  . cetirizine (ZYRTEC) 10 MG tablet Take 1 tablet (10 mg total) by mouth daily. 30 tablet 11  . JUNEL 1.5/30 1.5-30 MG-MCG tablet TAKE 1 TABLET BY MOUTH EVERY DAY 21 tablet 10  . Multiple Vitamin (MULTIVITAMIN) tablet Take 1 tablet by mouth daily.    . simvastatin (ZOCOR) 10 MG tablet Take 1 tablet (10 mg total) by mouth at bedtime. (Patient not taking: Reported on 06/02/2016) 90 tablet 3   No current facility-administered medications on file prior to visit.     Allergies  Allergen Reactions  . Tylenol With Codeine #3 [Acetaminophen-Codeine] Nausea And Vomiting    No past medical history on file.  No past surgical history on file.  Family History  Problem Relation Age of Onset  . Hyperlipidemia Mother   . Diabetes Maternal Grandmother   . Heart disease Maternal Grandmother   . Hypertension Maternal Grandmother     Social History   Social History  . Marital status: Single    Spouse name: N/A  . Number of children: N/A  . Years of education: N/A   Occupational History  . Not on file.   Social History Main Topics  . Smoking status: Former Research scientist (life sciences)  . Smokeless tobacco: Not on file  .  Alcohol use Yes     Comment: moderate  . Drug use: No  . Sexual activity: Not on file   Other Topics Concern  . Not on file   Social History Narrative  . No narrative on file   The PMH, PSH, Social History, Family History, Medications, and allergies have been reviewed in [facility], and have been updated if relevant.   Review of Systems  Constitutional: Negative.   Respiratory: Negative.   Cardiovascular: Negative.   Musculoskeletal: Positive for myalgias.  All other systems reviewed and are negative.      Objective:    BP (!) 142/88   Pulse 91   Temp 98.1 F (36.7 C) (Oral)   Wt 183 lb 4 oz (83.1 kg)   LMP 05/27/2016   SpO2 97%   BMI 32.98 kg/m    Physical Exam  Constitutional: She is oriented to person, place, and time. She appears well-developed and well-nourished.  HENT:  Head: Normocephalic.  Eyes: Conjunctivae are normal.  Neck: Normal range of motion.  Cardiovascular: Normal rate and regular rhythm.   Pulmonary/Chest: Effort normal and breath sounds normal.  Musculoskeletal: Normal range of motion.  Neurological: She is alert and oriented to person, place, and time. No cranial nerve deficit.  Skin: Skin is warm and dry. She is not diaphoretic.  Psychiatric: She has a normal mood and affect. Her behavior is normal. Judgment and thought content normal.  Nursing note reviewed.         Assessment & Plan:   Hyperlipidemia, unspecified hyperlipidemia type No Follow-up on file.

## 2016-07-02 ENCOUNTER — Encounter: Payer: Self-pay | Admitting: Family Medicine

## 2016-07-02 ENCOUNTER — Ambulatory Visit (INDEPENDENT_AMBULATORY_CARE_PROVIDER_SITE_OTHER): Payer: 59 | Admitting: Family Medicine

## 2016-07-02 VITALS — BP 124/80 | HR 113 | Temp 98.2°F | Wt 184.2 lb

## 2016-07-02 DIAGNOSIS — E7800 Pure hypercholesterolemia, unspecified: Secondary | ICD-10-CM | POA: Diagnosis not present

## 2016-07-02 LAB — COMPREHENSIVE METABOLIC PANEL
ALT: 60 U/L — AB (ref 0–35)
AST: 39 U/L — ABNORMAL HIGH (ref 0–37)
Albumin: 4.5 g/dL (ref 3.5–5.2)
Alkaline Phosphatase: 42 U/L (ref 39–117)
BILIRUBIN TOTAL: 0.8 mg/dL (ref 0.2–1.2)
BUN: 10 mg/dL (ref 6–23)
CO2: 27 meq/L (ref 19–32)
Calcium: 9.7 mg/dL (ref 8.4–10.5)
Chloride: 101 mEq/L (ref 96–112)
Creatinine, Ser: 0.72 mg/dL (ref 0.40–1.20)
GFR: 116.64 mL/min (ref >60.00)
GLUCOSE: 113 mg/dL — AB (ref 70–99)
POTASSIUM: 4.4 meq/L (ref 3.5–5.1)
SODIUM: 135 meq/L (ref 135–145)
Total Protein: 7.5 g/dL (ref 6.0–8.3)

## 2016-07-02 LAB — LIPID PANEL
CHOL/HDL RATIO: 3
Cholesterol: 207 mg/dL — ABNORMAL HIGH (ref 0–200)
HDL: 67.7 mg/dL (ref >39.00)
LDL Cholesterol: 113 mg/dL — ABNORMAL HIGH (ref 0–99)
NONHDL: 139.2
Triglycerides: 130 mg/dL (ref 0.0–149.0)
VLDL: 26 mg/dL (ref 0.0–40.0)

## 2016-07-02 NOTE — Progress Notes (Signed)
-  Subjective:   Patient ID: Patty Garner, female    DOB: 10/14/1980, 35 y.o.   MRN: [id number]  Patty Garner is a pleasant 35 y.o. year old female who presents to clinic today with Follow-up  on 07/02/2016  HPI:  HLD- has been working on diet and exercise but unfortunately, LDL cholesterol continued to increase. She therefore agreed to start pravachol.  She is here to follow this up today.  Tolerating pravachol well.  No body aches or fatigue. Lab Results  Component Value Date   CHOL 271 (H) 06/02/2016   HDL 64.10 06/02/2016   LDLCALC 193 (H) 06/02/2016   LDLDIRECT 160.9 11/24/2011   TRIG 68.0 06/02/2016   CHOLHDL 4 06/02/2016   Lab Results  Component Value Date   ALT 34 06/02/2016   AST 22 06/02/2016   ALKPHOS 41 06/02/2016   BILITOT 0.6 06/02/2016   Current Outpatient Prescriptions on File Prior to Visit  Medication Sig Dispense Refill  . cetirizine (ZYRTEC) 10 MG tablet Take 1 tablet (10 mg total) by mouth daily. 30 tablet 11  . JUNEL 1.5/30 1.5-30 MG-MCG tablet TAKE 1 TABLET BY MOUTH EVERY DAY 21 tablet 10  . Multiple Vitamin (MULTIVITAMIN) tablet Take 1 tablet by mouth daily.    . pravastatin (PRAVACHOL) 20 MG tablet Take 1 tablet (20 mg total) by mouth daily. 90 tablet 3   No current facility-administered medications on file prior to visit.     Allergies  Allergen Reactions  . Tylenol With Codeine #3 [Acetaminophen-Codeine] Nausea And Vomiting    No past medical history on file.  No past surgical history on file.  Family History  Problem Relation Age of Onset  . Hyperlipidemia Mother   . Diabetes Maternal Grandmother   . Heart disease Maternal Grandmother   . Hypertension Maternal Grandmother     Social History   Social History  . Marital status: Single    Spouse name: N/A  . Number of children: N/A  . Years of education: N/A   Occupational History  . Not on file.   Social History Main Topics  . Smoking status: Former Research scientist (life sciences)  . Smokeless  tobacco: Not on file  . Alcohol use Yes     Comment: moderate  . Drug use: No  . Sexual activity: Not on file   Other Topics Concern  . Not on file   Social History Narrative  . No narrative on file   The PMH, PSH, Social History, Family History, Medications, and allergies have been reviewed in [facility], and have been updated if relevant.   Review of Systems  Constitutional: Negative.   Musculoskeletal: Negative.   All other systems reviewed and are negative.      Objective:    BP 124/80   Pulse (!) 113   Temp 98.2 F (36.8 C) (Oral)   Wt 184 lb 4 oz (83.6 kg)   LMP 05/27/2016   SpO2 97%   BMI 33.16 kg/m    Physical Exam  Constitutional: She is oriented to person, place, and time. She appears well-developed and well-nourished. No distress.  HENT:  Head: Normocephalic and atraumatic.  Eyes: Conjunctivae are normal.  Cardiovascular: Normal rate.   Pulmonary/Chest: Effort normal.  Neurological: She is alert and oriented to person, place, and time. No cranial nerve deficit.  Skin: Skin is warm and dry. She is not diaphoretic.  Psychiatric: She has a normal mood and affect. Her behavior is normal. Judgment and thought content normal.  Nursing  note and vitals reviewed.         Assessment & Plan:   Pure hypercholesterolemia - Plan: Lipid panel, Comprehensive metabolic panel No Follow-up on file.

## 2016-07-02 NOTE — Assessment & Plan Note (Signed)
Continue pravachol- tolerating well. Check lipid panel and CMET today. The patient indicates understanding of these issues and agrees with the plan.

## 2016-07-02 NOTE — Patient Instructions (Signed)
Great to see you. Happy Holidays!  We will call you with your results from today.

## 2016-12-01 ENCOUNTER — Ambulatory Visit (INDEPENDENT_AMBULATORY_CARE_PROVIDER_SITE_OTHER): Payer: 59 | Admitting: Urgent Care

## 2016-12-01 ENCOUNTER — Encounter: Payer: Self-pay | Admitting: Urgent Care

## 2016-12-01 VITALS — BP 129/82 | HR 97 | Temp 98.1°F | Resp 18 | Ht 62.5 in | Wt 184.4 lb

## 2016-12-01 DIAGNOSIS — M79651 Pain in right thigh: Secondary | ICD-10-CM | POA: Diagnosis not present

## 2016-12-01 DIAGNOSIS — L02415 Cutaneous abscess of right lower limb: Secondary | ICD-10-CM | POA: Diagnosis not present

## 2016-12-01 MED ORDER — FLUCONAZOLE 150 MG PO TABS: 150.0000 mg | ORAL_TABLET | ORAL | 0 refills | Status: DC

## 2016-12-01 MED ORDER — CEPHALEXIN 500 MG PO CAPS
500.0000 mg | ORAL_CAPSULE | Freq: Three times a day (TID) | ORAL | 0 refills | Status: DC
Start: 2016-12-01 — End: 2017-01-30

## 2016-12-01 NOTE — Progress Notes (Signed)
   MRN: [id number] DOB: 1981/05/16  Subjective:   Patty Garner is a 36 y.o. female presenting for chief complaint of Mass (X 2 days- on right leg)  Reports 2 day history of right thigh pain over medial side. She has tried keeping a dressing on it. Denies fever, drainage of pus or bleeding. Admits history of abscesses.  Patty Garner has a current medication list which includes the following prescription(s): cetirizine, junel 1.5/30, multivitamin, and pravastatin. Also is allergic to tylenol with codeine #3 [acetaminophen-codeine]. Patty Garner denies past medical and surgical history.   Objective:   Vitals: BP 129/82 (BP Location: Right Arm, Patient Position: Sitting, Cuff Size: Large)   Pulse 97   Temp 98.1 F (36.7 C) (Oral)   Resp 18   Ht 5' 2.5" (1.588 m)   Wt 184 lb 6.4 oz (83.6 kg)   LMP 11/11/2016 (Approximate)   SpO2 99%   BMI 33.19 kg/m   Physical Exam  Constitutional: She is oriented to person, place, and time. She appears well-developed and well-nourished.  Cardiovascular: Normal rate.   Pulmonary/Chest: Effort normal.  Neurological: She is alert and oriented to person, place, and time.  Skin:      PROCEDURE NOTE: I&D of Abscess Verbal consent obtained. Local anesthesia with 1cc of 1% lidocaine with epinephrine. Incision of 0.5cm was made using a 11 blade, discharge of ~3cc of purulence and serosanguinous fluid. Wound cavity was explored with curved iris scissors and packed with 1/4" plain packing. Cleansed and dressed.   Assessment and Plan :   1. Pain in right thigh 2. Abscess of right thigh - Start Keflex, wound culture pending. RTC in 3 days for wound recheck.  Jaynee Eagles, PA-C [facility] [phone] 12/01/2016  5:44 PM

## 2016-12-01 NOTE — Patient Instructions (Addendum)
Incision and Drainage Incision and drainage is a surgical procedure to open and drain a fluid-filled sac. The sac may be filled with pus, mucus, or blood. Examples of fluid-filled sacs that may need surgical drainage include cysts, skin infections (abscesses), and red lumps that develop from a ruptured cyst or a small abscess (boils). You may need this procedure if the affected area is large, painful, infected, or not healing well. Tell a health care provider about:  Any allergies you have.  All medicines you are taking, including vitamins, herbs, eye drops, creams, and over-the-counter medicines.  Any problems you or family members have had with anesthetic medicines.  Any blood disorders you have.  Any surgeries you have had.  Any medical conditions you have.  Whether you are pregnant or may be pregnant. What are the risks? Generally, this is a safe procedure. However, problems may occur, including:  Infection.  Bleeding.  Allergic reactions to medicines.  Scarring. What happens before the procedure?  You may need an ultrasound or other imaging tests to see how large or deep the fluid-filled sac is.  You may have blood tests to check for infection.  You may get a tetanus shot.  You may be given antibiotic medicine to help prevent infection.  Follow instructions from your health care provider about eating or drinking restrictions.  Ask your health care provider about:  Changing or stopping your regular medicines. This is especially important if you are taking diabetes medicines or blood thinners.  Taking medicines such as aspirin and ibuprofen. These medicines can thin your blood. Do not take these medicines before your procedure if your health care provider instructs you not to.  Plan to have someone take you home after the procedure.  If you will be going home right after the procedure, plan to have someone stay with you for 24 hours. What happens during the  procedure?  To reduce your risk of infection:  Your health care team will wash or sanitize their hands.  Your skin will be washed with soap.  You will be given one or more of the following:  A medicine to help you relax (sedative).  A medicine to numb the area (local anesthetic).  A medicine to make you fall asleep (general anesthetic).  An incision will be made in the top of the fluid-filled sac.  The contents of the sac may be squeezed out, or a syringe or tube (catheter)may be used to empty the sac.  The catheter may be left in place for several weeks to drain any fluid. Or, your health care provider may stitch open the edges of the incision to make a long-term opening for drainage (marsupialization).  The inside of the sac may be washed out (irrigated) with a sterile solution and packed with gauze before it is covered with a bandage (dressing). The procedure may vary among health care providers and hospitals. What happens after the procedure?  Your blood pressure, heart rate, breathing rate, and blood oxygen level will be monitored often until the medicines you were given have worn off.  Do not drive for 24 hours if you received a sedative. This information is not intended to replace advice given to you by your health care provider. Make sure you discuss any questions you have with your health care provider. Document Released: 12/31/2000 Document Revised: 12/13/2015 Document Reviewed: 04/27/2015 Elsevier Interactive Patient Education  2017 [employer].    IF you received an x-ray today, you will receive an invoice from  [facility]. Please contact [facility] at [phone] with questions or concerns regarding your invoice.   IF you received labwork today, you will receive an invoice from [place]. Please contact [facility] at [phone] with questions or concerns regarding your invoice.   Our billing staff will not be able to assist you with questions  regarding bills from these companies.  You will be contacted with the lab results as soon as they are available. The fastest way to get your results is to activate your My Chart account. Instructions are located on the last page of this paperwork. If you have not heard from [place] regarding the results in 2 weeks, please contact this office.

## 2016-12-04 ENCOUNTER — Encounter: Payer: Self-pay | Admitting: Urgent Care

## 2016-12-04 ENCOUNTER — Ambulatory Visit (INDEPENDENT_AMBULATORY_CARE_PROVIDER_SITE_OTHER): Payer: 59 | Admitting: Urgent Care

## 2016-12-04 VITALS — BP 120/80 | HR 104 | Temp 98.9°F | Resp 16 | Ht 63.0 in | Wt 184.4 lb

## 2016-12-04 DIAGNOSIS — M79651 Pain in right thigh: Secondary | ICD-10-CM

## 2016-12-04 DIAGNOSIS — Z5189 Encounter for other specified aftercare: Secondary | ICD-10-CM

## 2016-12-04 DIAGNOSIS — L02415 Cutaneous abscess of right lower limb: Secondary | ICD-10-CM

## 2016-12-04 LAB — WOUND CULTURE: ORGANISM ID, BACTERIA: NONE SEEN

## 2016-12-04 NOTE — Patient Instructions (Addendum)
Change your dressing once daily after you shower or bathe. In 2 days, you can remove the packing and placed dressing over it until the wound fully heals. Please keep any adhesive away from the skin abrasion near your wound. You can use a large dressing to cover the entire thing.    IF you received an x-ray today, you will receive an invoice from [facility]. Please contact [facility] at [phone] with questions or concerns regarding your invoice.   IF you received labwork today, you will receive an invoice from [place]. Please contact [facility] at [phone] with questions or concerns regarding your invoice.   Our billing staff will not be able to assist you with questions regarding bills from these companies.  You will be contacted with the lab results as soon as they are available. The fastest way to get your results is to activate your My Chart account. Instructions are located on the last page of this paperwork. If you have not heard from [place] regarding the results in 2 weeks, please contact this office.

## 2016-12-04 NOTE — Progress Notes (Signed)
    MRN: [id number] DOB: 04-15-1981  Subjective:   Patty Garner is a 36 y.o. female presenting for follow up on abscess of thigh. Patient is doing significantly better. Denies fever, spontaneous drainage of pus or bleeding, redness, swelling. She is continuing to use Keflex. Wound culture was negative.   Patty Garner has a current medication list which includes the following prescription(s): cephalexin, cetirizine, junel 1.5/30, multivitamin, fluconazole, and pravastatin. Also is allergic to tylenol with codeine #3 [acetaminophen-codeine]. Patty Garner denies past medical and surgical history.   Objective:   Vitals: BP 120/80   Pulse (!) 104   Temp 98.9 F (37.2 C) (Oral)   Resp 16   Ht 5\' 3"  (1.6 m)   Wt 184 lb 6.4 oz (83.6 kg)   LMP 11/11/2016 (Approximate)   SpO2 99%   BMI 32.66 kg/m   Physical Exam  Constitutional: She is oriented to person, place, and time. She appears well-developed and well-nourished.  Cardiovascular: Normal rate.   Pulmonary/Chest: Effort normal.  Neurological: She is alert and oriented to person, place, and time.  Skin:       Wound Care: Abscess of right thigh Dressing removed. Packing in place, removed with evidence of slight purulence. Wound repacked loosely with 1/4" packing, cleansed and dressed.  Assessment and Plan :   1. Pain in right thigh 2. Abscess of right thigh 3. Encounter for wound care - Wound care reviewed. No need for additional f/u unless patient fails to achieve resolution.  Jaynee Eagles, PA-C [facility] [phone] 12/04/2016 9:40 AM

## 2017-01-30 ENCOUNTER — Ambulatory Visit (INDEPENDENT_AMBULATORY_CARE_PROVIDER_SITE_OTHER): Payer: 59 | Admitting: Physician Assistant

## 2017-01-30 ENCOUNTER — Encounter: Payer: Self-pay | Admitting: Physician Assistant

## 2017-01-30 VITALS — BP 125/82 | HR 85 | Temp 98.2°F | Resp 18 | Ht 63.0 in | Wt 188.4 lb

## 2017-01-30 DIAGNOSIS — H109 Unspecified conjunctivitis: Secondary | ICD-10-CM | POA: Diagnosis not present

## 2017-01-30 MED ORDER — POLYMYXIN B-TRIMETHOPRIM 10000-0.1 UNIT/ML-% OP SOLN: 2.0000 [drp] | Freq: Four times a day (QID) | OPHTHALMIC | 0 refills | Status: AC

## 2017-01-30 NOTE — Patient Instructions (Addendum)
Please use the drops as prescribed. You can use them up to 7-10 days if you do not have complete resolution of symptoms after 5 days. Try to avoid rubbing that eye and spreading it to the other eye. If any of your symptoms worsen, or you develop new pain, foreign body sensation, inability to look at light, fever, nausea, or vomiting, seek care immediately at ED. In the future, try OTC flonase for allergy control. Thank you for letting me participate in your health and well being.    Bacterial Conjunctivitis Bacterial conjunctivitis is an infection of your conjunctiva. This is the clear membrane that covers the white part of your eye and the inner surface of your eyelid. This condition can make your eye:  Red or pink.  Itchy.  This condition is caused by bacteria. This condition spreads very easily from person to person (is contagious) and from one eye to the other eye. Follow these instructions at home: Medicines  Take or apply your antibiotic medicine as told by your doctor. Do not stop taking or applying the antibiotic even if you start to feel better.  Take or apply over-the-counter and prescription medicines only as told by your doctor.  Do not touch your eyelid with the eye drop bottle or the ointment tube. Managing discomfort  Wipe any fluid from your eye with a warm, wet washcloth or a cotton ball.  Place a cool, clean washcloth on your eye. Do this for 10-20 minutes, 3-4 times per day. General instructions  Do not wear contact lenses until the irritation is gone. Wear glasses until your doctor says it is okay to wear contacts.  Do not wear eye makeup until your symptoms are gone. Throw away any old makeup.  Change or wash your pillowcase every day.  Do not share towels or washcloths with anyone.  Wash your hands often with soap and water. Use paper towels to dry your hands.  Do not touch or rub your eyes.  Do not drive or use heavy machinery if your vision is  blurry. Contact a doctor if:  You have a fever.  Your symptoms do not get better after 10 days. Get help right away if:  You have a fever and your symptoms suddenly get worse.  You have very bad pain when you move your eye.  Your face: ? Hurts. ? Is red. ? Is swollen.  You have sudden loss of vision. This information is not intended to replace advice given to you by your health care provider. Make sure you discuss any questions you have with your health care provider. Document Released: 04/15/2008 Document Revised: 12/13/2015 Document Reviewed: 04/19/2015 Elsevier Interactive Patient Education  2018 [employer].   IF you received an x-ray today, you will receive an invoice from [facility]. Please contact [facility] at [phone] with questions or concerns regarding your invoice.   IF you received labwork today, you will receive an invoice from [place]. Please contact [facility] at [phone] with questions or concerns regarding your invoice.   Our billing staff will not be able to assist you with questions regarding bills from these companies.  You will be contacted with the lab results as soon as they are available. The fastest way to get your results is to activate your My Chart account. Instructions are located on the last page of this paperwork. If you have not heard from [place] regarding the results in 2 weeks, please contact this office.

## 2017-01-30 NOTE — Progress Notes (Signed)
BRIANNA BENNETT  MRN: [id number] DOB: [birth year]  Subjective:  Patty Garner is a 36 y.o. female seen in office today for a chief complaint of eye redness x 1 week. Notes her seasonal allergies typically cause eye problems for her and she uses OTC allergy eye drops frequently. The right eye was controlled with these eye drops over the past week but she woke up this morning and the right eye was crusted shut and it was red again. Has associated discomfort, watery/purluletn discharge, and mild blurred vision. Denies acute injury, itching, left eye involvement, photophobia, foreign body sensation, and pain with eye movements. Denies contact lens use.   Review of Systems  Constitutional: Negative for chills, diaphoresis, fatigue and fever.  Gastrointestinal: Negative for nausea and vomiting.  Neurological: Negative for headaches.    Patient Active Problem List   Diagnosis Date Noted  . HLD (hyperlipidemia) 06/02/2016  . Obesity 08/28/2015  . Allergic rhinitis 10/26/2014  . Menorrhagia 08/23/2012    Current Outpatient Prescriptions on File Prior to Visit  Medication Sig Dispense Refill  . cetirizine (ZYRTEC) 10 MG tablet Take 1 tablet (10 mg total) by mouth daily. 30 tablet 11  . JUNEL 1.5/30 1.5-30 MG-MCG tablet TAKE 1 TABLET BY MOUTH EVERY DAY 21 tablet 10  . cephALEXin (KEFLEX) 500 MG capsule Take 1 capsule (500 mg total) by mouth 3 (three) times daily. (Patient not taking: Reported on 01/30/2017) 30 capsule 0  . fluconazole (DIFLUCAN) 150 MG tablet Take 1 tablet (150 mg total) by mouth once a week. (Patient not taking: Reported on 12/04/2016) 2 tablet 0  . Multiple Vitamin (MULTIVITAMIN) tablet Take 1 tablet by mouth daily.    . pravastatin (PRAVACHOL) 20 MG tablet Take 1 tablet (20 mg total) by mouth daily. (Patient not taking: Reported on 12/04/2016) 90 tablet 3   No current facility-administered medications on file prior to visit.     Allergies  Allergen Reactions  . Tylenol With  Codeine #3 [Acetaminophen-Codeine] Nausea And Vomiting     Objective:  BP 125/82   Pulse 85   Temp 98.2 F (36.8 C) (Oral)   Resp 18   Ht 5\' 3"  (1.6 m)   Wt 188 lb 6.4 oz (85.5 kg)   LMP 01/09/2017   SpO2 100%   BMI 33.37 kg/m   Visual Acuity Screening   Right eye Left eye Both eyes  Without correction: 20/20-1 20/15 20/15  With correction:       Physical Exam  Constitutional: She is oriented to person, place, and time and well-developed, well-nourished, and in no distress.  HENT:  Head: Normocephalic and atraumatic.  Right Ear: Tympanic membrane, external ear and ear canal normal.  Left Ear: Tympanic membrane, external ear and ear canal normal.  Nose: Mucosal edema (moderate bilaterally) present.  Mouth/Throat: Uvula is midline, oropharynx is clear and moist and mucous membranes are normal.  Eyes: Right eye visual fields normal. Pupils are equal, round, and reactive to light. EOM are normal. Right eye exhibits discharge (mild amount of purulent discharge noted on lower eyelid and at medial canthus ). No foreign body present in the right eye. Left eye exhibits no discharge. Right conjunctiva is injected (moderate). Left conjunctiva is not injected.  No pain with EOMs. No pain with penlight examination. No tenderness to palpation of orbits bilaterally.   Neck: Normal range of motion.  Pulmonary/Chest: Effort normal.  Neurological: She is alert and oriented to person, place, and time. Gait normal.  Skin: Skin is  warm and dry.  Psychiatric: Affect normal.  Vitals reviewed.   Assessment and Plan :   1. Bacterial conjunctivitis of right eye PE findings consistent with bacterial conjunctivitis. Given educational material on bacterial conjunctivitis. Pt instructed to return to clinic if symptoms worsen, do not improve, or as needed - trimethoprim-polymyxin b (POLYTRIM) ophthalmic solution; Place 2 drops into the right eye every 6 (six) hours.  Dispense: 10 mL; Refill:  0  Tenna Delaine, PA-C  [facility] [place] 01/30/2017 1:49 PM

## 2017-02-25 ENCOUNTER — Telehealth: Payer: Self-pay | Admitting: Physician Assistant

## 2017-02-25 MED ORDER — POLYMYXIN B-TRIMETHOPRIM 10000-0.1 UNIT/ML-% OP SOLN: 1.0000 [drp] | Freq: Four times a day (QID) | OPHTHALMIC | 0 refills | Status: AC

## 2017-02-25 NOTE — Telephone Encounter (Signed)
Patient called stating that she did not use her eye drops completely because when her eye got better she stopped using them, but then her eye infection came back yesterday and she started using them again but she stated she did not have enough to last her the whole 7-10 days this time. So she wants to know if she can get the trimethoprim-polymyxin b (POLYTRIM) ophthalmic solution Called into the [facility] on [address]  Her call back number is [phone]

## 2017-02-25 NOTE — Telephone Encounter (Signed)
Please advise 

## 2017-02-25 NOTE — Telephone Encounter (Signed)
I have given her a refill for this. Please call and let pt know. If she does not get any better with this please return to office for further evaluation.

## 2017-02-26 NOTE — Telephone Encounter (Signed)
Left message medication was refilled and sent to pharmacy. Advised to return to clinic for further refills.

## 2017-03-13 ENCOUNTER — Encounter: Payer: Self-pay | Admitting: Physician Assistant

## 2017-03-13 ENCOUNTER — Ambulatory Visit (INDEPENDENT_AMBULATORY_CARE_PROVIDER_SITE_OTHER): Payer: 59 | Admitting: Physician Assistant

## 2017-03-13 VITALS — BP 122/81 | HR 82 | Temp 98.2°F | Resp 18 | Ht 62.6 in | Wt 186.6 lb

## 2017-03-13 DIAGNOSIS — H10409 Unspecified chronic conjunctivitis, unspecified eye: Secondary | ICD-10-CM | POA: Diagnosis not present

## 2017-03-13 DIAGNOSIS — J0100 Acute maxillary sinusitis, unspecified: Secondary | ICD-10-CM | POA: Diagnosis not present

## 2017-03-13 DIAGNOSIS — J069 Acute upper respiratory infection, unspecified: Secondary | ICD-10-CM | POA: Diagnosis not present

## 2017-03-13 DIAGNOSIS — Z8669 Personal history of other diseases of the nervous system and sense organs: Secondary | ICD-10-CM

## 2017-03-13 MED ORDER — BENZONATATE 100 MG PO CAPS: 100.0000 mg | ORAL_CAPSULE | Freq: Three times a day (TID) | ORAL | 0 refills | Status: DC | PRN

## 2017-03-13 MED ORDER — NAPHAZOLINE-PHENIRAMINE 0.025-0.3 % OP SOLN: 1.0000 [drp] | Freq: Four times a day (QID) | OPHTHALMIC | 0 refills | Status: DC | PRN

## 2017-03-13 MED ORDER — FLUTICASONE PROPIONATE 50 MCG/ACT NA SUSP: 2.0000 | Freq: Every day | NASAL | 0 refills | Status: DC

## 2017-03-13 MED ORDER — AMOXICILLIN-POT CLAVULANATE 875-125 MG PO TABS: 1.0000 | ORAL_TABLET | Freq: Two times a day (BID) | ORAL | 0 refills | Status: AC

## 2017-03-13 MED ORDER — HYDROCODONE-HOMATROPINE 5-1.5 MG/5ML PO SYRP: 5.0000 mL | ORAL_SOLUTION | Freq: Three times a day (TID) | ORAL | 0 refills | Status: DC | PRN

## 2017-03-13 NOTE — Progress Notes (Signed)
MRN: [id number] DOB: 29-Mar-1981  Subjective:   Patty Garner is a 36 y.o. female presenting for chief complaint of Sinus Problem (X 1 week- headache,ear, cough) and Eye Problem (f/u- itching) .  Reports one week history of sinus headache, sinus pain, rhinorrhea, ear fullness, sore throat and dry cough (no hemoptysis), and subjective fever at night. Has tried [employer], [employer] and mucinex which is providing some relief. Denies wheezing and shortness of breath, chills, fatigue, nausea, vomiting, abdominal pain and diarrhea. Has not had sick contact with anyone. Has history of seasonal allergies. Takes zyrtec daily. No history of asthma. Denies smoking. Coughing throughout the night. Has used cough syrup with codeine in the past and this really helps.  Patient also like to discuss her eye issues. She was initially evaluated by me for eye redness on 01/30/2017. Has history of seasonal allergies and red eyes. However during that visit her right eye was causing her more issues. She was waking up with crusty eyes very frequently. Having mild purulent drainage of right eye throughout the day. She used to use daily" Visine with antihistamine" drops her red eyes. At that visit she was also given Polytrim drops to use and instructed to return if no improvement. Today, she notes that she has been using the Visine drops in both eyes. She has also consistently use Polytrim drops since her last visit. Notes that if she does not use the Polytrim drops she will have mild purulent drainage in her right eye. She also has itching. She does feel like there is something in her eye frequently. Denies acute injury, visual disturbance, photophobia, and pain with eye movements. Denies contact lens or eye glass use. She used the polytrim drops today.  Patty Garner has a current medication list which includes the following prescription(s): cetirizine, junel 1.5/30, multivitamin, and pravastatin. Also is allergic to tylenol with codeine #3  [acetaminophen-codeine].  Patty Garner  has no past medical history on file. Also  has no past surgical history on file.   Objective:   Vitals: BP 122/81 (BP Location: Right Arm, Patient Position: Sitting, Cuff Size: Large)   Pulse 82   Temp 98.2 F (36.8 C) (Oral)   Resp 18   Ht 5' 2.6" (1.59 m)   Wt 186 lb 9.6 oz (84.6 kg)   LMP 03/03/2017 (Approximate)   SpO2 99%   BMI 33.48 kg/m   Physical Exam  Constitutional: She is oriented to person, place, and time. She appears well-developed and well-nourished. No distress.  HENT:  Head: Normocephalic and atraumatic.  Right Ear: External ear and ear canal normal. Tympanic membrane is not erythematous and not bulging. A middle ear effusion is present.  Left Ear: External ear and ear canal normal. Tympanic membrane is not erythematous and not bulging. A middle ear effusion is present.  Nose: Mucosal edema (moderate bilaterally) present. Right sinus exhibits maxillary sinus tenderness (moderate). Right sinus exhibits no frontal sinus tenderness. Left sinus exhibits maxillary sinus tenderness (moderate). Left sinus exhibits no frontal sinus tenderness.  Mouth/Throat: Uvula is midline and mucous membranes are normal. Posterior oropharyngeal erythema (cobblestoning noted) present. No tonsillar exudate.  Eyes: Pupils are equal, round, and reactive to light. EOM are normal. Right conjunctiva is not injected. Left conjunctiva is not injected.  Fundoscopic exam:      The right eye shows no AV nicking, no hemorrhage and no papilledema.       The left eye shows no AV nicking, no hemorrhage and no papilledema.  Slit lamp  exam:      The right eye shows no fluorescein uptake.  No pain with palpation of orbits bilaterally.   Neck: Normal range of motion.  Cardiovascular: Normal rate, regular rhythm, normal heart sounds and intact distal pulses.   Pulmonary/Chest: Effort normal and breath sounds normal. She has no wheezes. She has no rhonchi. She has no rales.    Lymphadenopathy:       Head (right side): No submental, no submandibular, no tonsillar, no preauricular, no posterior auricular and no occipital adenopathy present.       Head (left side): No submental, no submandibular, no tonsillar, no preauricular, no posterior auricular and no occipital adenopathy present.    She has no cervical adenopathy.       Right: No supraclavicular adenopathy present.       Left: No supraclavicular adenopathy present.  Neurological: She is alert and oriented to person, place, and time.  Skin: Skin is warm and dry.  Psychiatric: She has a normal mood and affect.  Vitals reviewed.   No results found for this or any previous visit (from the past 24 hour(s)).  Assessment and Plan :  1. Acute upper respiratory infection Due to duration, hx of allergies, and PE exam findings will treat both symptomatically for URI sx and empirically for bacterial etiology with abx for sinusitis. Return to clinic if symptoms worsen, do not improve in 7-10 days, or as needed - HYDROcodone-homatropine (HYCODAN) 5-1.5 MG/5ML syrup; Take 5 mLs by mouth every 8 (eight) hours as needed for cough.  Dispense: 120 mL; Refill: 0 - fluticasone (FLONASE) 50 MCG/ACT nasal spray; Place 2 sprays into both nostrils daily.  Dispense: 16 g; Refill: 0 - benzonatate (TESSALON) 100 MG capsule; Take 1-2 capsules (100-200 mg total) by mouth 3 (three) times daily as needed for cough.  Dispense: 40 capsule; Refill: 0 2. Acute maxillary sinusitis, recurrence not specified - amoxicillin-clavulanate (AUGMENTIN) 875-125 MG tablet; Take 1 tablet by mouth 2 (two) times daily.  Dispense: 14 tablet; Refill: 0  3. Chronic conjunctivitis, unspecified chronic conjunctivitis type, unspecified laterality 4. History of itching of eye Eye exam normal today. Instructed to discontinue Polytrim at this time. May use Naphcon-A drops for symptomatic relief. I have also placed a referral for ophthalmology at this time for further  evaluation. Instructed to return to clinic if symptoms worsen, do not improve, or as needed - naphazoline-pheniramine (NAPHCON-A) 0.025-0.3 % ophthalmic solution; Place 1 drop into both eyes 4 (four) times daily as needed for eye irritation.  Dispense: 15 mL; Refill: 0 - Ambulatory referral to Ophthalmology  Tenna Delaine, PA-C  [facility] 03/15/2017 10:01 PM

## 2017-03-13 NOTE — Patient Instructions (Addendum)
-   Your physical exam findings are consistent with a acute upper respiratory. Due to your history of allergies, will treat for sinus infection with antibiotic at this time.  - I recommend you rest, drink plenty of fluids, eat light meals including soups.  - You may use cough syrup at night for your cough and sore throat, Tessalon pearls during the day. Be aware that cough syrup can definitely make you drowsy and sleepy so do not drive or operate any heavy machinery if it is affecting you during the day.  - You may also use Tylenol or ibuprofen over-the-counter for your sore throat.  - Continue taking zyrtec daily. Once your sinus infection has resolved, you may use flonase daily. - Please let me know if you are not seeing any improvement or get worse in 7-10 days.  For continued eye issues, I recommend discontinuing polytrim at this time. You may use napchon-a drops daily in both eyes for redness and itching. I have also given you a referral to opthalmology at this time. Please follow up with them for further evaluation. Thank you for letting me participate in your health and well being.     IF you received an x-ray today, you will receive an invoice from [facility]. Please contact [facility] at [phone] with questions or concerns regarding your invoice.   IF you received labwork today, you will receive an invoice from [place]. Please contact [facility] at [phone] with questions or concerns regarding your invoice.   Our billing staff will not be able to assist you with questions regarding bills from these companies.  You will be contacted with the lab results as soon as they are available. The fastest way to get your results is to activate your My Chart account. Instructions are located on the last page of this paperwork. If you have not heard from [place] regarding the results in 2 weeks, please contact this office.

## 2017-03-25 ENCOUNTER — Other Ambulatory Visit: Payer: Self-pay | Admitting: Family Medicine

## 2017-04-06 DIAGNOSIS — H10413 Chronic giant papillary conjunctivitis, bilateral: Secondary | ICD-10-CM | POA: Diagnosis not present

## 2017-04-06 DIAGNOSIS — H04123 Dry eye syndrome of bilateral lacrimal glands: Secondary | ICD-10-CM | POA: Diagnosis not present

## 2017-04-09 ENCOUNTER — Other Ambulatory Visit: Payer: Self-pay | Admitting: Physician Assistant

## 2017-04-09 DIAGNOSIS — J069 Acute upper respiratory infection, unspecified: Secondary | ICD-10-CM

## 2017-04-20 ENCOUNTER — Other Ambulatory Visit: Payer: Self-pay | Admitting: Family Medicine

## 2017-05-15 ENCOUNTER — Other Ambulatory Visit: Payer: Self-pay | Admitting: Family Medicine

## 2017-05-20 ENCOUNTER — Other Ambulatory Visit: Payer: Self-pay | Admitting: Family Medicine

## 2017-05-20 MED ORDER — NORETHINDRONE ACET-ETHINYL EST 1.5-30 MG-MCG PO TABS: ORAL_TABLET | ORAL | 1 refills | Status: DC

## 2017-05-20 NOTE — Telephone Encounter (Signed)
In Aug advised needs wellness visit/denied/thx dmf

## 2017-05-20 NOTE — Telephone Encounter (Signed)
Sent in 2 months of BCP till appt in December/thx dmf

## 2017-05-20 NOTE — Telephone Encounter (Signed)
Copied from [place] #[id number]. Topic: Inquiry >> May 20, 2017 12:42 PM Malena Catholic I, NT wrote: Reason for CRM: pt Call for [place]   Call to patient and let her know that her birth control can not be refilled because she does not have annual exam scheduled. Patient states she has 3 pills left and will schedule and come to annual if she can get her Rx extended through her appointment. Told patient I would forward her request to her provider- she has scheduled 12/3 at the new location. She has been made aware of the move and plans to follow her provider.

## 2017-06-22 ENCOUNTER — Ambulatory Visit: Payer: 59 | Admitting: Family Medicine

## 2017-07-02 ENCOUNTER — Other Ambulatory Visit: Payer: Self-pay | Admitting: Family Medicine

## 2017-07-22 ENCOUNTER — Encounter: Payer: 59 | Admitting: Family Medicine

## 2017-07-29 ENCOUNTER — Other Ambulatory Visit: Payer: Self-pay

## 2017-07-29 ENCOUNTER — Encounter: Payer: 59 | Admitting: Family Medicine

## 2017-07-29 MED ORDER — NORETHINDRONE ACET-ETHINYL EST 1.5-30 MG-MCG PO TABS: ORAL_TABLET | ORAL | 0 refills | Status: DC

## 2017-08-24 ENCOUNTER — Other Ambulatory Visit: Payer: Self-pay

## 2017-08-25 ENCOUNTER — Ambulatory Visit (INDEPENDENT_AMBULATORY_CARE_PROVIDER_SITE_OTHER): Payer: 59 | Admitting: Family Medicine

## 2017-08-25 ENCOUNTER — Encounter: Payer: Self-pay | Admitting: Family Medicine

## 2017-08-25 VITALS — BP 112/80 | HR 105 | Temp 99.4°F | Ht 63.0 in | Wt 194.0 lb

## 2017-08-25 DIAGNOSIS — Z0000 Encounter for general adult medical examination without abnormal findings: Secondary | ICD-10-CM | POA: Insufficient documentation

## 2017-08-25 DIAGNOSIS — E785 Hyperlipidemia, unspecified: Secondary | ICD-10-CM | POA: Diagnosis not present

## 2017-08-25 DIAGNOSIS — Z23 Encounter for immunization: Secondary | ICD-10-CM

## 2017-08-25 DIAGNOSIS — Z0001 Encounter for general adult medical examination with abnormal findings: Secondary | ICD-10-CM

## 2017-08-25 DIAGNOSIS — M62830 Muscle spasm of back: Secondary | ICD-10-CM | POA: Diagnosis not present

## 2017-08-25 LAB — CBC WITH DIFFERENTIAL/PLATELET
BASOS ABS: 0 10*3/uL (ref 0.0–0.1)
Basophils Relative: 0.8 % (ref 0.0–3.0)
Eosinophils Absolute: 0.1 10*3/uL (ref 0.0–0.7)
Eosinophils Relative: 2.9 % (ref 0.0–5.0)
HCT: 41.5 % (ref 36.0–46.0)
HEMOGLOBIN: 14 g/dL (ref 12.0–15.0)
LYMPHS ABS: 1.8 10*3/uL (ref 0.7–4.0)
Lymphocytes Relative: 38.5 % (ref 12.0–46.0)
MCHC: 33.7 g/dL (ref 30.0–36.0)
MCV: 88.4 fl (ref 78.0–100.0)
MONO ABS: 0.3 10*3/uL (ref 0.1–1.0)
Monocytes Relative: 7.3 % (ref 3.0–12.0)
NEUTROS PCT: 50.5 % (ref 43.0–77.0)
Neutro Abs: 2.4 10*3/uL (ref 1.4–7.7)
Platelets: 315 10*3/uL (ref 150.0–400.0)
RBC: 4.69 Mil/uL (ref 3.87–5.11)
RDW: 13.4 % (ref 11.5–15.5)
WBC: 4.8 10*3/uL (ref 4.0–10.5)

## 2017-08-25 LAB — COMPREHENSIVE METABOLIC PANEL
ALK PHOS: 48 U/L (ref 39–117)
ALT: 52 U/L — AB (ref 0–35)
AST: 30 U/L (ref 0–37)
Albumin: 4.4 g/dL (ref 3.5–5.2)
BILIRUBIN TOTAL: 0.6 mg/dL (ref 0.2–1.2)
BUN: 11 mg/dL (ref 6–23)
CO2: 25 mEq/L (ref 19–32)
CREATININE: 0.65 mg/dL (ref 0.40–1.20)
Calcium: 9.3 mg/dL (ref 8.4–10.5)
Chloride: 101 mEq/L (ref 96–112)
GFR: 130.46 mL/min (ref >60.00)
Glucose, Bld: 105 mg/dL — ABNORMAL HIGH (ref 70–99)
Potassium: 3.8 mEq/L (ref 3.5–5.1)
SODIUM: 135 meq/L (ref 135–145)
TOTAL PROTEIN: 7.3 g/dL (ref 6.0–8.3)

## 2017-08-25 LAB — LIPID PANEL
CHOLESTEROL: 301 mg/dL — AB (ref 0–200)
HDL: 86.8 mg/dL (ref >39.00)
LDL Cholesterol: 195 mg/dL — ABNORMAL HIGH (ref 0–99)
NONHDL: 213.76
Total CHOL/HDL Ratio: 3
Triglycerides: 93 mg/dL (ref 0.0–149.0)
VLDL: 18.6 mg/dL (ref 0.0–40.0)

## 2017-08-25 LAB — TSH: TSH: 1.24 u[IU]/mL (ref 0.35–4.50)

## 2017-08-25 NOTE — Progress Notes (Signed)
Subjective:   Patient ID: Patty Garner, female    DOB: 11/21/1980, 37 y.o.   MRN: [id number]  Patty Garner is a pleasant 37 y.o. year old female who presents to clinic today with Annual Exam (Patient is here today for a CPE with fasting labs.  She declines the flu shot but agrees to the Tdap.  She has not been taking the BCP and states that she was taking them for the cramping and it didn't really help for that anyways so she will just go without.) and Back Pain (Patient also states that she is experiencing back pain.  Pain started on 2.2.2019.  She feels that it is for prolonged sitting at her new job.  She has also recently started working out.  It started out on right lower back and locked up then spread to left side and radiates into left buttock.)  on 08/25/2017  HPI:  G0- no h/o abnormal pap smears. Last pap smear was done by me on 08/28/15.  Sexually active with females only.  Back pain- started on 08/22/17.  She feels it from prolonged sitting at her new job.  She did recently started working out as well.  Back pain started in right lower back and now spread to left side and left buttock. Lab Results  Component Value Date   CHOL 207 (H) 07/02/2016   HDL 67.70 07/02/2016   LDLCALC 113 (H) 07/02/2016   LDLDIRECT 160.9 11/24/2011   TRIG 130.0 07/02/2016   CHOLHDL 3 07/02/2016   Lab Results  Component Value Date   WBC 6.4 08/28/2015   HGB 12.9 08/28/2015   HCT 38.5 08/28/2015   MCV 87.1 08/28/2015   PLT 335.0 08/28/2015    Current Outpatient Medications on File Prior to Visit  Medication Sig Dispense Refill  . cetirizine (ZYRTEC) 10 MG tablet Take 1 tablet (10 mg total) by mouth daily. 30 tablet 11  . Multiple Vitamin (MULTIVITAMIN) tablet Take 1 tablet by mouth daily.     No current facility-administered medications on file prior to visit.     Allergies  Allergen Reactions  . Tylenol With Codeine #3 [Acetaminophen-Codeine] Nausea And Vomiting    No past medical  history on file.  No past surgical history on file.  Family History  Problem Relation Age of Onset  . Diabetes Maternal Grandmother   . Heart disease Maternal Grandmother   . Hypertension Maternal Grandmother     Social History   Socioeconomic History  . Marital status: Single    Spouse name: Not on file  . Number of children: Not on file  . Years of education: Not on file  . Highest education level: Not on file  Social Needs  . Financial resource strain: Not on file  . Food insecurity - worry: Not on file  . Food insecurity - inability: Not on file  . Transportation needs - medical: Not on file  . Transportation needs - non-medical: Not on file  Occupational History  . Not on file  Tobacco Use  . Smoking status: Former Research scientist (life sciences)  . Smokeless tobacco: Never Used  Substance and Sexual Activity  . Alcohol use: Yes    Alcohol/week: 4.8 oz    Types: 4 Cans of beer, 4 Shots of liquor per week    Comment: moderate  . Drug use: No  . Sexual activity: Not on file  Other Topics Concern  . Not on file  Social History Narrative  . Not on file  The PMH, PSH, Social History, Family History, Medications, and allergies have been reviewed in [facility], and have been updated if relevant.  Review of Systems  Constitutional: Negative.   HENT: Negative.   Eyes: Negative.   Respiratory: Negative.   Cardiovascular: Negative.   Gastrointestinal: Negative.   Endocrine: Negative.   Genitourinary: Negative.   Musculoskeletal: Positive for back pain.  Skin: Negative.   Allergic/Immunologic: Negative.   Neurological: Negative.   Hematological: Negative.   Psychiatric/Behavioral: Negative.   All other systems reviewed and are negative.      Objective:    BP 112/80 (BP Location: Left Arm, Patient Position: Sitting, Cuff Size: Normal)   Pulse (!) 105   Temp 99.4 F (37.4 C) (Oral)   Ht 5\' 3"  (1.6 m)   Wt 194 lb (88 kg)   LMP 08/18/2017   SpO2 98%   BMI 34.37 kg/m    Physical  Exam   General:  Well-developed,well-nourished,in no acute distress; alert,appropriate and cooperative throughout examination Head:  normocephalic and atraumatic.   Eyes:  vision grossly intact, PERRL Ears:  R ear normal and L ear normal externally, TMs clear bilaterally Nose:  no external deformity.   Mouth:  good dentition.   Neck:  No deformities, masses, or tenderness noted. Breasts:  No mass, nodules, thickening, tenderness, bulging, retraction, inflamation, nipple discharge or skin changes noted.   Lungs:  Normal respiratory effort, chest expands symmetrically. Lungs are clear to auscultation, no crackles or wheezes. Heart:  Normal rate and regular rhythm. S1 and S2 normal without gallop, murmur, click, rub or other extra sounds. Abdomen:  Bowel sounds positive,abdomen soft and non-tender without masses, organomegaly or hernias noted. Msk:  No deformity or scoliosis noted of thoracic or lumbar spine.   SLR neg bilaterally, some tightness bilateral lumbar paraspinous muscles Extremities:  No clubbing, cyanosis, edema, or deformity noted with normal full range of motion of all joints.   Neurologic:  alert & oriented X3 and gait normal.   Skin:  Intact without suspicious lesions or rashes Cervical Nodes:  No lymphadenopathy noted Axillary Nodes:  No palpable lymphadenopathy Psych:  Cognition and judgment appear intact. Alert and cooperative with normal attention span and concentration. No apparent delusions, illusions, hallucinations       Assessment & Plan:   Well woman exam (no gynecological exam) - Plan: CBC with Differential/Platelet, TSH  Need for Tdap vaccination - Plan: Tdap vaccine greater than or equal to 7yo IM  Hyperlipidemia, unspecified hyperlipidemia type - Plan: Comprehensive metabolic panel, Lipid panel No Follow-up on file.

## 2017-08-25 NOTE — Patient Instructions (Signed)
Great to see you. I will call you with your lab results from today and you can view them online.   

## 2017-08-25 NOTE — Assessment & Plan Note (Signed)
Reviewed preventive care protocols, scheduled due services, and updated immunizations Discussed nutrition, exercise, diet, and healthy lifestyle.  Orders Placed This Encounter  Procedures  . Tdap vaccine greater than or equal to 39yo IM  . CBC with Differential/Platelet  . Comprehensive metabolic panel  . Lipid panel  . TSH    

## 2017-08-25 NOTE — Assessment & Plan Note (Signed)
Due for labs today. 

## 2017-08-25 NOTE — Assessment & Plan Note (Signed)
Advised supportive care-   For acute pain, rest, intermittent application of heat (do not sleep on heating pad), analgesics and muscle relaxants are recommended. Discussed longer term treatment plan of prn NSAID's and discussed a home back care exercise program with flexion exercise routine. Proper lifting with avoidance of heavy lifting discussed. Consider Physical Therapy and XRay studies if not improving. Call or return to clinic prn if these symptoms worsen or fail to improve as anticipated.

## 2017-11-09 ENCOUNTER — Encounter: Payer: Self-pay | Admitting: Family Medicine

## 2017-11-09 ENCOUNTER — Other Ambulatory Visit: Payer: Self-pay | Admitting: Family Medicine

## 2017-11-09 MED ORDER — NORETHINDRONE ACET-ETHINYL EST 1.5-30 MG-MCG PO TABS: 1.0000 | ORAL_TABLET | Freq: Every day | ORAL | 11 refills | Status: DC

## 2018-06-24 ENCOUNTER — Other Ambulatory Visit: Payer: Self-pay

## 2018-06-24 ENCOUNTER — Encounter: Payer: Self-pay | Admitting: Family Medicine

## 2018-06-24 ENCOUNTER — Ambulatory Visit: Payer: 59 | Admitting: Family Medicine

## 2018-06-24 VITALS — BP 137/87 | HR 68 | Temp 98.9°F | Resp 17 | Ht 63.0 in | Wt 195.6 lb

## 2018-06-24 DIAGNOSIS — H6122 Impacted cerumen, left ear: Secondary | ICD-10-CM

## 2018-06-24 NOTE — Patient Instructions (Addendum)
   If you have lab work done today you will be contacted with your lab results within the next 2 weeks.  If you have not heard from us then please contact us. The fastest way to get your results is to register for My Chart.   IF you received an x-ray today, you will receive an invoice from [facility]. Please contact [facility] at [phone] with questions or concerns regarding your invoice.   IF you received labwork today, you will receive an invoice from [facility]. Please contact [facility] at [phone] with questions or concerns regarding your invoice.   Our billing staff will not be able to assist you with questions regarding bills from these companies.  You will be contacted with the lab results as soon as they are available. The fastest way to get your results is to activate your My Chart account. Instructions are located on the last page of this paperwork. If you have not heard from us regarding the results in 2 weeks, please contact this office.      Earwax Buildup, Adult The ears produce a substance called earwax that helps keep bacteria out of the ear and protects the skin in the ear canal. Occasionally, earwax can build up in the ear and cause discomfort or hearing loss. What increases the risk? This condition is more likely to develop in people who:  Are female.  Are elderly.  Naturally produce more earwax.  Clean their ears often with cotton swabs.  Use earplugs often.  Use in-ear headphones often.  Wear hearing aids.  Have narrow ear canals.  Have earwax that is overly thick or sticky.  Have eczema.  Are dehydrated.  Have excess hair in the ear canal.  What are the signs or symptoms? Symptoms of this condition include:  Reduced or muffled hearing.  A feeling of fullness in the ear or feeling that the ear is plugged.  Fluid coming from the ear.  Ear pain.  Ear itch.  Ringing in the ear.  Coughing.  An obvious piece of  earwax that can be seen inside the ear canal.  How is this diagnosed? This condition may be diagnosed based on:  Your symptoms.  Your medical history.  An ear exam. During the exam, your health care provider will look into your ear with an instrument called an otoscope.  You may have tests, including a hearing test. How is this treated? This condition may be treated by:  Using ear drops to soften the earwax.  Having the earwax removed by a health care provider. The health care provider may: ? Flush the ear with water. ? Use an instrument that has a loop on the end (curette). ? Use a suction device.  Surgery to remove the wax buildup. This may be done in severe cases.  Follow these instructions at home:  Take over-the-counter and prescription medicines only as told by your health care provider.  Do not put any objects, including cotton swabs, into your ear. You can clean the opening of your ear canal with a washcloth or facial tissue.  Follow instructions from your health care provider about cleaning your ears. Do not over-clean your ears.  Drink enough fluid to keep your urine clear or pale yellow. This will help to thin the earwax.  Keep all follow-up visits as told by your health care provider. If earwax builds up in your ears often or if you use hearing aids, consider seeing your health care provider for routine,   preventive ear cleanings. Ask your health care provider how often you should schedule your cleanings.  If you have hearing aids, clean them according to instructions from the manufacturer and your health care provider. Contact a health care provider if:  You have ear pain.  You develop a fever.  You have blood, pus, or other fluid coming from your ear.  You have hearing loss.  You have ringing in your ears that does not go away.  Your symptoms do not improve with treatment.  You feel like the room is spinning (vertigo). Summary  Earwax can build up in  the ear and cause discomfort or hearing loss.  The most common symptoms of this condition include reduced or muffled hearing and a feeling of fullness in the ear or feeling that the ear is plugged.  This condition may be diagnosed based on your symptoms, your medical history, and an ear exam.  This condition may be treated by using ear drops to soften the earwax or by having the earwax removed by a health care provider.  Do not put any objects, including cotton swabs, into your ear. You can clean the opening of your ear canal with a washcloth or facial tissue. This information is not intended to replace advice given to you by your health care provider. Make sure you discuss any questions you have with your health care provider. Document Released: 08/14/2004 Document Revised: 09/17/2016 Document Reviewed: 09/17/2016 Elsevier Interactive Patient Education  2018 [employer]vier Inc.  

## 2018-06-24 NOTE — Progress Notes (Signed)
Chief Complaint  Patient presents with  . cerumen impaction left ear since Sunday 06/20/2018    Per pt she had cold sxs on last week and is unsure if this is related.  Used peroxide in both ears-right ear cleared but left is still clogged.    HPI  Left ear feeling full No pain with chewing or yawning No pain in the ear No fevers or chills No tinnitus or jaw pain  No past medical history on file.  Current Outpatient Medications  Medication Sig Dispense Refill  . cetirizine (ZYRTEC) 10 MG tablet Take 1 tablet (10 mg total) by mouth daily. 30 tablet 11  . Multiple Vitamin (MULTIVITAMIN) tablet Take 1 tablet by mouth daily.    . Norethindrone Acetate-Ethinyl Estradiol (JUNEL 1.5/30) 1.5-30 MG-MCG tablet Take 1 tablet by mouth daily. 21 tablet 11   No current facility-administered medications for this visit.     Allergies:  Allergies  Allergen Reactions  . Tylenol With Codeine #3 [Acetaminophen-Codeine] Nausea And Vomiting    No past surgical history on file.  Social History   Socioeconomic History  . Marital status: Single    Spouse name: Not on file  . Number of children: Not on file  . Years of education: Not on file  . Highest education level: Not on file  Occupational History  . Not on file  Social Needs  . Financial resource strain: Not on file  . Food insecurity:    Worry: Not on file    Inability: Not on file  . Transportation needs:    Medical: Not on file    Non-medical: Not on file  Tobacco Use  . Smoking status: Former Research scientist (life sciences)  . Smokeless tobacco: Never Used  Substance and Sexual Activity  . Alcohol use: Yes    Alcohol/week: 8.0 standard drinks    Types: 4 Cans of beer, 4 Shots of liquor per week    Comment: moderate  . Drug use: No  . Sexual activity: Not on file  Lifestyle  . Physical activity:    Days per week: Not on file    Minutes per session: Not on file  . Stress: Not on file  Relationships  . Social connections:    Talks on phone:  Not on file    Gets together: Not on file    Attends religious service: Not on file    Active member of club or organization: Not on file    Attends meetings of clubs or organizations: Not on file    Relationship status: Not on file  Other Topics Concern  . Not on file  Social History Narrative  . Not on file    Family History  Problem Relation Age of Onset  . Diabetes Maternal Grandmother   . Heart disease Maternal Grandmother   . Hypertension Maternal Grandmother      ROS Review of Systems See HPI Constitution: No fevers or chills No malaise No diaphoresis Skin: No rash or itching Eyes: no blurry vision, no double vision Neuro: no dizziness or headaches all others reviewed and negative   Objective: Vitals:   06/24/18 0933  BP: 137/87  Pulse: 68  Resp: 17  Temp: 98.9 F (37.2 C)  TempSrc: Oral  SpO2: 98%  Weight: 195 lb 9.6 oz (88.7 kg)  Height: 5\' 3"  (1.6 m)    Physical Exam General: alert, oriented, in NAD Head: normocephalic, atraumatic, no sinus tenderness Eyes: EOM intact, no scleral icterus or conjunctival injection Ears: TM clear on  the right, left TM impacted on the left  Nose: mucosa nonerythematous, nonedematous Throat: no pharyngeal exudate or erythema   Assessment and Plan Ulanda was seen today for cerumen impaction left ear since sunday 06/20/2018.  Diagnoses and all orders for this visit:  Impacted cerumen of left ear -     Ear Lavage   Resolved with ear lavage  Ataya Murdy A Nolon Rod

## 2018-10-04 ENCOUNTER — Other Ambulatory Visit: Payer: Self-pay

## 2018-10-04 MED ORDER — NORETHINDRONE ACET-ETHINYL EST 1.5-30 MG-MCG PO TABS: 1.0000 | ORAL_TABLET | Freq: Every day | ORAL | 2 refills | Status: DC

## 2018-10-13 ENCOUNTER — Telehealth: Payer: Self-pay | Admitting: Family Medicine

## 2018-10-13 MED ORDER — NORETHINDRONE ACET-ETHINYL EST 1.5-30 MG-MCG PO TABS: 1.0000 | ORAL_TABLET | Freq: Every day | ORAL | 2 refills | Status: DC

## 2018-10-13 NOTE — Telephone Encounter (Signed)
eRx refill sent. 

## 2018-10-13 NOTE — Addendum Note (Signed)
Addended by: Lucille Passy on: 10/13/2018 11:20 AM   Modules accepted: Orders

## 2018-10-13 NOTE — Telephone Encounter (Signed)
I called to reschedule patients CPE out until June. Patient was fine with that but had concerns that she might run out of her birth control Junel before then and wanted to see if she could still get a refill until then.

## 2018-10-28 ENCOUNTER — Encounter: Payer: 59 | Admitting: Family Medicine

## 2018-11-14 ENCOUNTER — Encounter: Payer: Self-pay | Admitting: Family Medicine

## 2018-11-15 ENCOUNTER — Ambulatory Visit (INDEPENDENT_AMBULATORY_CARE_PROVIDER_SITE_OTHER): Payer: 59 | Admitting: Family Medicine

## 2018-11-15 ENCOUNTER — Encounter: Payer: Self-pay | Admitting: Family Medicine

## 2018-11-15 VITALS — Ht 63.0 in

## 2018-11-15 DIAGNOSIS — H109 Unspecified conjunctivitis: Secondary | ICD-10-CM | POA: Insufficient documentation

## 2018-11-15 DIAGNOSIS — H1032 Unspecified acute conjunctivitis, left eye: Secondary | ICD-10-CM

## 2018-11-15 MED ORDER — POLYMYXIN B-TRIMETHOPRIM 10000-0.1 UNIT/ML-% OP SOLN: 1.0000 [drp] | OPHTHALMIC | 0 refills | Status: DC

## 2018-11-15 NOTE — Progress Notes (Signed)
Virtual Visit via Video   Due to the COVID-19 pandemic, this visit was completed with telemedicine (audio/video) technology to reduce patient and provider exposure as well as to preserve personal protective equipment.   I connected with Patty Garner on 11/15/18 at  9:20 AM EDT by a video enabled telemedicine application and verified that I am speaking with the correct person using two identifiers. Location patient: Home Location provider: [facility], Office Persons participating in the virtual visit: Robynn Pane, MD   I discussed the limitations of evaluation and management by telemedicine and the availability of in person appointments. The patient expressed understanding and agreed to proceed.  Care Team   Patient Care Team: Lucille Passy, MD as PCP - General (Family Medicine)  Subjective:   HPI:  SUBJECTIVE:  38 y.o. female with burning, redness, discharge and mattering in left eye for 1 day.  No other symptoms.  No significant prior ophthalmological history. No change in visual acuity, no photophobia, no severe eye pain.      Review of Systems  Constitutional: Negative.   HENT: Negative.   Eyes: Positive for discharge and redness. Negative for blurred vision, double vision, photophobia and pain.  Respiratory: Negative.   Cardiovascular: Negative.   Gastrointestinal: Negative.   Genitourinary: Negative.   Skin: Negative.   Neurological: Negative.   Endo/Heme/Allergies: Negative.   Psychiatric/Behavioral: Negative.  Suicidal ideas: .ca.  All other systems reviewed and are negative.    Patient Active Problem List   Diagnosis Date Noted  . Well woman exam (no gynecological exam) 08/25/2017  . Lumbar paraspinal muscle spasm 08/25/2017  . HLD (hyperlipidemia) 06/02/2016  . Obesity 08/28/2015  . Allergic rhinitis 10/26/2014  . Menorrhagia 08/23/2012    Social History   Tobacco Use  . Smoking status: Former Research scientist (life sciences)  . Smokeless tobacco: Never  Used  Substance Use Topics  . Alcohol use: Yes    Alcohol/week: 8.0 standard drinks    Types: 4 Cans of beer, 4 Shots of liquor per week    Comment: moderate    Current Outpatient Medications:  .  cetirizine (ZYRTEC) 10 MG tablet, Take 1 tablet (10 mg total) by mouth daily., Disp: 30 tablet, Rfl: 11 .  Multiple Vitamin (MULTIVITAMIN) tablet, Take 1 tablet by mouth daily., Disp: , Rfl:  .  Norethindrone Acetate-Ethinyl Estradiol (JUNEL 1.5/30) 1.5-30 MG-MCG tablet, Take 1 tablet by mouth daily., Disp: 21 tablet, Rfl: 2  Allergies  Allergen Reactions  . Tylenol With Codeine #3 [Acetaminophen-Codeine] Nausea And Vomiting    Objective:  Ht 5\' 3"  (1.6 m)   BMI 34.65 kg/m   VITALS: Per patient if applicable, see vitals. GENERAL: Alert, appears well and in no acute distress. HEENT: Eyes: left eye with findings of typical conjunctivitis noted; erythema and discharge. PERRLA, no foreign body noted. No periorbital cellulitis. The corneas are clear and fundi normal. Visual acuity normal.  NECK: Normal movements of the head and neck. CARDIOPULMONARY: No increased WOB. Speaking in clear sentences. I:E ratio WNL.  MS: Moves all visible extremities without noticeable abnormality. PSYCH: Pleasant and cooperative, well-groomed. Speech normal rate and rhythm. Affect is appropriate. Insight and judgement are appropriate. Attention is focused, linear, and appropriate.  NEURO: CN grossly intact. Oriented as arrived to appointment on time with no prompting. Moves both UE equally.  SKIN: No obvious lesions, wounds, erythema, or cyanosis noted on face or hands.  Depression screen [facility] 2/9 06/24/2018 03/13/2017 01/30/2017  Decreased Interest 0 0 0  Down, Depressed, Hopeless 0 0 0  PHQ - 2 Score 0 0 0    Assessment and Plan:   There are no diagnoses linked to this encounter.  Marland Kitchen COVID-19 Education:The signs and symptoms of COVID-19 were discussed with the patient and how to seek care for testing if  needed. The importance of social distancing was discussed today. . Reviewed expectations re: course of current medical issues. . Discussed self-management of symptoms. . Outlined signs and symptoms indicating need for more acute intervention. . Patient verbalized understanding and all questions were answered. Marland Kitchen Health Maintenance issues including appropriate healthy diet, exercise, and smoking avoidance were discussed with patient. . See orders for this visit as documented in the electronic medical record.  Arnette Norris, MD 11/15/2018  Records requested if needed. Time spent: 15 minutes, of which >50% was spent in obtaining information about her symptoms, reviewing her previous labs, evaluations, and treatments, counseling her about her condition (please see the discussed topics above), and developing a plan to further investigate it; she had a number of questions which I addressed.

## 2018-11-15 NOTE — Assessment & Plan Note (Addendum)
New-  Conjunctivitis - probably bacterial  Antibiotic drops per order- polytrim. Hygiene discussed. If other family members develop same condition, may use same medication for them if they are not known to be allergic to it. Call prn.

## 2018-12-29 ENCOUNTER — Encounter: Payer: 59 | Admitting: Family Medicine

## 2019-02-15 ENCOUNTER — Encounter: Payer: Self-pay | Admitting: Family Medicine

## 2019-02-21 ENCOUNTER — Encounter: Payer: 59 | Admitting: Family Medicine

## 2019-03-02 ENCOUNTER — Ambulatory Visit: Payer: 59 | Admitting: Family Medicine

## 2019-03-07 NOTE — Progress Notes (Signed)
Subjective:   Patient ID: Patty Garner, female    DOB: 1981-06-23, 38 y.o.   MRN: [id number]  Patty Garner is a pleasant 38 y.o. year old female who presents to clinic today with Annual Exam (Pt screened at vehicle. She is here for a CPE without PAP. She is fasting.)  on 03/09/2019  HPI:  Health Maintenance  Topic Date Due  . INFLUENZA VACCINE  03/15/2019 (Originally 02/19/2019)  . PAP SMEAR-Modifier  03/15/2019 (Originally 08/27/2018)  . TETANUS/TDAP  08/26/2027  . HIV Screening  Completed    G0- no h/o abnormal pap smears. Last pap smear was done by me on 08/28/15. Never had mammogram.  Doing well- recently got engaged.  Sexually active with females only.  Pleased with current OCP- needs refills.  HLD- Not taking statins- couldn't tolerate.  She hast weight with diet and exercise.  Lab Results  Component Value Date   CHOL 301 (H) 08/25/2017   HDL 86.80 08/25/2017   LDLCALC 195 (H) 08/25/2017   LDLDIRECT 160.9 11/24/2011   TRIG 93.0 08/25/2017   CHOLHDL 3 08/25/2017   The 10-year ASCVD risk score Mikey Bussing DC Jr., et al., 2013) is: 0.2%   Values used to calculate the score:     Age: 38 years     Sex: Female     Is Non-Hispanic African American: Yes     Diabetic: No     Tobacco smoker: No     Systolic Blood Pressure: 409 mmHg     Is BP treated: No     HDL Cholesterol: 86.8 mg/dL     Total Cholesterol: 301 mg/dL  Lab Results  Component Value Date   WBC 4.8 08/25/2017   HGB 14.0 08/25/2017   HCT 41.5 08/25/2017   MCV 88.4 08/25/2017   PLT 315.0 08/25/2017    Current Outpatient Medications on File Prior to Visit  Medication Sig Dispense Refill  . cetirizine (ZYRTEC) 10 MG tablet Take 1 tablet (10 mg total) by mouth daily. 30 tablet 11  . Multiple Vitamin (MULTIVITAMIN) tablet Take 1 tablet by mouth daily.     No current facility-administered medications on file prior to visit.     Allergies  Allergen Reactions  . Statins     myalgias  . Tylenol With  Codeine #3 [Acetaminophen-Codeine] Nausea And Vomiting    No past medical history on file.  No past surgical history on file.  Family History  Problem Relation Age of Onset  . Diabetes Maternal Grandmother   . Heart disease Maternal Grandmother   . Hypertension Maternal Grandmother     Social History   Socioeconomic History  . Marital status: Single    Spouse name: Not on file  . Number of children: Not on file  . Years of education: Not on file  . Highest education level: Not on file  Occupational History  . Not on file  Social Needs  . Financial resource strain: Not on file  . Food insecurity    Worry: Not on file    Inability: Not on file  . Transportation needs    Medical: Not on file    Non-medical: Not on file  Tobacco Use  . Smoking status: Former Research scientist (life sciences)  . Smokeless tobacco: Never Used  Substance and Sexual Activity  . Alcohol use: Yes    Alcohol/week: 8.0 standard drinks    Types: 4 Cans of beer, 4 Shots of liquor per week    Comment: moderate  . Drug use: No  .  Sexual activity: Not on file  Lifestyle  . Physical activity    Days per week: Not on file    Minutes per session: Not on file  . Stress: Not on file  Relationships  . Social Herbalist on phone: Not on file    Gets together: Not on file    Attends religious service: Not on file    Active member of club or organization: Not on file    Attends meetings of clubs or organizations: Not on file    Relationship status: Not on file  . Intimate partner violence    Fear of current or ex partner: Not on file    Emotionally abused: Not on file    Physically abused: Not on file    Forced sexual activity: Not on file  Other Topics Concern  . Not on file  Social History Narrative  . Not on file   The PMH, PSH, Social History, Family History, Medications, and allergies have been reviewed in [facility], and have been updated if relevant.  Review of Systems  Constitutional: Negative.   HENT:  Negative.   Eyes: Negative.   Respiratory: Negative.   Cardiovascular: Negative.   Gastrointestinal: Negative.   Endocrine: Negative.   Genitourinary: Negative.   Skin: Negative.   Allergic/Immunologic: Negative.   Neurological: Negative.   Hematological: Negative.   Psychiatric/Behavioral: Negative.   All other systems reviewed and are negative.      Objective:    BP 118/74 (BP Location: Left Arm, Patient Position: Sitting, Cuff Size: Normal)   Pulse 80   Temp 98.6 F (37 C) (Oral)   Ht 5\' 3"  (1.6 m)   Wt 181 lb 3.2 oz (82.2 kg)   LMP 03/02/2019   SpO2 98%   BMI 32.10 kg/m   Wt Readings from Last 3 Encounters:  03/09/19 181 lb 3.2 oz (82.2 kg)  06/24/18 195 lb 9.6 oz (88.7 kg)  08/25/17 194 lb (88 kg)    Physical Exam   General:  Well-developed,well-nourished,in no acute distress; alert,appropriate and cooperative throughout examination Head:  normocephalic and atraumatic.   Eyes:  vision grossly intact, PERRL Ears:  R ear normal and L ear normal externally, TMs clear bilaterally Nose:  no external deformity.   Mouth:  good dentition.   Neck:  No deformities, masses, or tenderness noted. Breasts:  No mass, nodules, thickening, tenderness, bulging, retraction, inflamation, nipple discharge or skin changes noted.   Lungs:  Normal respiratory effort, chest expands symmetrically. Lungs are clear to auscultation, no crackles or wheezes. Heart:  Normal rate and regular rhythm. S1 and S2 normal without gallop, murmur, click, rub or other extra sounds. Abdomen:  Bowel sounds positive,abdomen soft and non-tender without masses, organomegaly or hernias noted. Msk:  No deformity or scoliosis noted of thoracic or lumbar spine.   SLR neg bilaterally, some tightness bilateral lumbar paraspinous muscles Extremities:  No clubbing, cyanosis, edema, or deformity noted with normal full range of motion of all joints.   Neurologic:  alert & oriented X3 and gait normal.   Skin:   Intact without suspicious lesions or rashes Cervical Nodes:  No lymphadenopathy noted Axillary Nodes:  No palpable lymphadenopathy Psych:  Cognition and judgment appear intact. Alert and cooperative with normal attention span and concentration. No apparent delusions, illusions, hallucinations

## 2019-03-08 ENCOUNTER — Telehealth: Payer: Self-pay

## 2019-03-08 NOTE — Telephone Encounter (Signed)
Questions for Screening COVID-19  Symptom onset: none  Travel or Contacts: none  During this illness, did/does the patient experience any of the following symptoms? Fever >100.35F []   Yes [x]   No []   Unknown Subjective fever (felt feverish) []   Yes [x]   No []   Unknown Chills []   Yes [x]   No []   Unknown Muscle aches (myalgia) []   Yes [x]   No []   Unknown Runny nose (rhinorrhea) []   Yes [x]   No []   Unknown Sore throat []   Yes [x]   No []   Unknown Cough (new onset or worsening of chronic cough) []   Yes [x]   No []   Unknown Shortness of breath (dyspnea) []   Yes [x]   No []   Unknown Nausea or vomiting []   Yes [x]   No []   Unknown Headache []   Yes [x]   No []   Unknown Abdominal pain  []   Yes [x]   No []   Unknown Diarrhea (?3 loose/looser than normal stools/24hr period) []   Yes [x]   No []   Unknown Other, specify:  Patient risk factors: Smoker? []   Current []   Former []   Never If female, currently pregnant? []   Yes []   No  Patient Active Problem List   Diagnosis Date Noted  . Lumbar paraspinal muscle spasm 08/25/2017  . HLD (hyperlipidemia) 06/02/2016  . Obesity 08/28/2015  . Allergic rhinitis 10/26/2014  . Menorrhagia 08/23/2012    Plan:  []   High risk for COVID-19 with red flags go to ED (with CP, SOB, weak/lightheaded, or fever > 101.5). Call ahead.  []   High risk for COVID-19 but stable. Inform provider and coordinate time for [facility] visit.   []   No red flags but URI signs or symptoms okay for [facility] visit.

## 2019-03-09 ENCOUNTER — Encounter: Payer: Self-pay | Admitting: Family Medicine

## 2019-03-09 ENCOUNTER — Ambulatory Visit (INDEPENDENT_AMBULATORY_CARE_PROVIDER_SITE_OTHER): Payer: 59 | Admitting: Family Medicine

## 2019-03-09 VITALS — BP 118/74 | HR 80 | Temp 98.6°F | Ht 63.0 in | Wt 181.2 lb

## 2019-03-09 DIAGNOSIS — Z0000 Encounter for general adult medical examination without abnormal findings: Secondary | ICD-10-CM

## 2019-03-09 DIAGNOSIS — Z30011 Encounter for initial prescription of contraceptive pills: Secondary | ICD-10-CM

## 2019-03-09 DIAGNOSIS — E785 Hyperlipidemia, unspecified: Secondary | ICD-10-CM | POA: Diagnosis not present

## 2019-03-09 DIAGNOSIS — Z1239 Encounter for other screening for malignant neoplasm of breast: Secondary | ICD-10-CM | POA: Diagnosis not present

## 2019-03-09 LAB — COMPREHENSIVE METABOLIC PANEL
ALT: 28 U/L (ref 0–35)
AST: 19 U/L (ref 0–37)
Albumin: 4.6 g/dL (ref 3.5–5.2)
Alkaline Phosphatase: 43 U/L (ref 39–117)
BUN: 9 mg/dL (ref 6–23)
CO2: 24 mEq/L (ref 19–32)
Calcium: 9.4 mg/dL (ref 8.4–10.5)
Chloride: 103 mEq/L (ref 96–112)
Creatinine, Ser: 0.68 mg/dL (ref 0.40–1.20)
GFR: 115.61 mL/min (ref >60.00)
Glucose, Bld: 93 mg/dL (ref 70–99)
Potassium: 4.3 mEq/L (ref 3.5–5.1)
Sodium: 136 mEq/L (ref 135–145)
Total Bilirubin: 0.7 mg/dL (ref 0.2–1.2)
Total Protein: 7.5 g/dL (ref 6.0–8.3)

## 2019-03-09 LAB — LIPID PANEL
Cholesterol: 272 mg/dL — ABNORMAL HIGH (ref 0–200)
HDL: 61.3 mg/dL (ref >39.00)
LDL Cholesterol: 198 mg/dL — ABNORMAL HIGH (ref 0–99)
NonHDL: 211.05
Total CHOL/HDL Ratio: 4
Triglycerides: 66 mg/dL (ref 0.0–149.0)
VLDL: 13.2 mg/dL (ref 0.0–40.0)

## 2019-03-09 LAB — CBC WITH DIFFERENTIAL/PLATELET
Basophils Absolute: 0 10*3/uL (ref 0.0–0.1)
Basophils Relative: 0.4 % (ref 0.0–3.0)
Eosinophils Absolute: 0.1 10*3/uL (ref 0.0–0.7)
Eosinophils Relative: 1.5 % (ref 0.0–5.0)
HCT: 40 % (ref 36.0–46.0)
Hemoglobin: 13.4 g/dL (ref 12.0–15.0)
Lymphocytes Relative: 35.8 % (ref 12.0–46.0)
Lymphs Abs: 2.1 10*3/uL (ref 0.7–4.0)
MCHC: 33.4 g/dL (ref 30.0–36.0)
MCV: 88.6 fl (ref 78.0–100.0)
Monocytes Absolute: 0.4 10*3/uL (ref 0.1–1.0)
Monocytes Relative: 7 % (ref 3.0–12.0)
Neutro Abs: 3.2 10*3/uL (ref 1.4–7.7)
Neutrophils Relative %: 55.3 % (ref 43.0–77.0)
Platelets: 286 10*3/uL (ref 150.0–400.0)
RBC: 4.51 Mil/uL (ref 3.87–5.11)
RDW: 13.1 % (ref 11.5–15.5)
WBC: 5.8 10*3/uL (ref 4.0–10.5)

## 2019-03-09 LAB — TSH: TSH: 1.23 u[IU]/mL (ref 0.35–4.50)

## 2019-03-09 LAB — HEMOGLOBIN A1C: Hgb A1c MFr Bld: 5.7 % (ref 4.6–6.5)

## 2019-03-09 MED ORDER — NORETHINDRONE ACET-ETHINYL EST 1.5-30 MG-MCG PO TABS: 1.0000 | ORAL_TABLET | Freq: Every day | ORAL | 11 refills | Status: DC

## 2019-03-09 NOTE — Patient Instructions (Addendum)
.  Great to see you!  I will call you with your lab results from today and you can view them online.   Please call the breast center at [phone] to schedule your mammogram.  You look great!  The 10-year ASCVD risk score Mikey Bussing DC Brooke Bonito., et al., 2013) is: 0.2%   Values used to calculate the score:     Age: 38 years     Sex: Female     Is Non-Hispanic African American: Yes     Diabetic: No     Tobacco smoker: No     Systolic Blood Pressure: 465 mmHg     Is BP treated: No     HDL Cholesterol: 86.8 mg/dL     Total Cholesterol: 301 mg/dL

## 2019-03-09 NOTE — Assessment & Plan Note (Signed)
Please with current OCPs, eRx refills sent.

## 2019-03-09 NOTE — Assessment & Plan Note (Signed)
Reviewed preventive care protocols, scheduled due services, and updated immunizations Discussed nutrition, exercise, diet, and healthy lifestyle.  Mammogram ordered and phone number for breast center given to pt to schedule her own mammogram. 

## 2019-03-09 NOTE — Assessment & Plan Note (Signed)
Has lost weight weight diet and exercise. Could not tolerate statins. The 10-year ASCVD risk score Mikey Bussing DC Brooke Bonito., et al., 2013) is: 0.2%   Values used to calculate the score:     Age: 38 years     Sex: Female     Is Non-Hispanic African American: Yes     Diabetic: No     Tobacco smoker: No     Systolic Blood Pressure: 552 mmHg     Is BP treated: No     HDL Cholesterol: 86.8 mg/dL     Total Cholesterol: 301 mg/dL

## 2019-03-15 ENCOUNTER — Ambulatory Visit
Admission: RE | Admit: 2019-03-15 | Discharge: 2019-03-15 | Disposition: A | Discharge status: Home / Self Care | Payer: 59 | Source: Ambulatory Visit | Attending: Family Medicine | Admitting: Family Medicine

## 2019-03-15 ENCOUNTER — Other Ambulatory Visit: Payer: Self-pay

## 2019-03-15 DIAGNOSIS — Z1239 Encounter for other screening for malignant neoplasm of breast: Secondary | ICD-10-CM

## 2019-05-30 ENCOUNTER — Encounter: Payer: Self-pay | Admitting: Family Medicine

## 2019-06-02 ENCOUNTER — Telehealth (INDEPENDENT_AMBULATORY_CARE_PROVIDER_SITE_OTHER): Payer: 59 | Admitting: Family Medicine

## 2019-06-02 DIAGNOSIS — L2481 Irritant contact dermatitis due to metals: Secondary | ICD-10-CM | POA: Diagnosis not present

## 2019-06-02 DIAGNOSIS — L230 Allergic contact dermatitis due to metals: Secondary | ICD-10-CM | POA: Insufficient documentation

## 2019-06-02 MED ORDER — CLOBETASOL PROPIONATE 0.05 % EX CREA: 1.0000 "application " | TOPICAL_CREAM | Freq: Two times a day (BID) | CUTANEOUS | 0 refills | Status: DC

## 2019-06-02 NOTE — Progress Notes (Signed)
Virtual Visit via Video   Due to the COVID-19 pandemic, this visit was completed with telemedicine (audio/video) technology to reduce patient and provider exposure as well as to preserve personal protective equipment.   I connected with Patty Garner by a video enabled telemedicine application and verified that I am speaking with the correct person using two identifiers. Location patient: Home Location provider: [facility], Office Persons participating in the virtual visit: Robynn Pane, MD   I discussed the limitations of evaluation and management by telemedicine and the availability of in person appointments. The patient expressed understanding and agreed to proceed.  Care Team   Patient Care Team: Lucille Passy, MD as PCP - General (Family Medicine)  Subjective:   HPI:   Patient sent the following message:  "Hi Dr. Deborra Medina, I hope all is well with you. I think I may have psoriasis or eczema on my wrists. I noticed it about 2 months ago. I thought it would go away on it's on but no matter how much cream or Eucerin I use it won't subside. Can you check it out and let me know what I can use to get rid of it? It itches so bad I can't stop scratching it. Gross I know. Thanks in advance"    It's it's on both wrists, right > left.  Was wearing her fit bit on left side last Saturday and moved it to right wrist.  It is very itchy.  Rash didn't start on her left wrist until she moved the fit bit.  She just ordered a new one.  Has not had a rash like this before.  Review of Systems  Constitutional: Negative.   HENT: Negative.   Eyes: Negative.   Respiratory: Negative.   Cardiovascular: Negative.   Gastrointestinal: Negative.   Genitourinary: Negative.   Musculoskeletal: Negative.   Skin: Positive for itching and rash.  All other systems reviewed and are negative.    Patient Active Problem List   Diagnosis Date Noted  . 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 Well woman exam without gynecological exam 03/09/2019  . HLD (hyperlipidemia) 06/02/2016  . Obesity 08/28/2015  . Allergic rhinitis 10/26/2014  . Contraceptive management 06/07/2014    Social History   Tobacco Use  . Smoking status: Former Research scientist (life sciences)  . Smokeless tobacco: Never Used  Substance Use Topics  . Alcohol use: Yes    Alcohol/week: 8.0 standard drinks    Types: 4 Cans of beer, 4 Shots of liquor per week    Comment: moderate    Current Outpatient Medications:  .  cetirizine (ZYRTEC) 10 MG tablet, Take 1 tablet (10 mg total) by mouth daily., Disp: 30 tablet, Rfl: 11 .  clobetasol cream (TEMOVATE) [id number] %, Apply 1 application topically 2 (two) times daily., Disp: 30 g, Rfl: 0 .  Multiple Vitamin (MULTIVITAMIN) tablet, Take 1 tablet by mouth daily., Disp: , Rfl:  .  Norethindrone Acetate-Ethinyl Estradiol (JUNEL 1.5/30) 1.5-30 MG-MCG tablet, Take 1 tablet by mouth daily., Disp: 21 tablet, Rfl: 11  Allergies  Allergen Reactions  . Statins     myalgias  . Tylenol With Codeine #3 [Acetaminophen-Codeine] Nausea And Vomiting    Objective:   VITALS: Per patient if applicable, see vitals. GENERAL: Alert, appears well and in no acute distress. HEENT: Atraumatic, conjunctiva clear, no obvious abnormalities on inspection of external nose and ears. NECK: Normal movements of the head and neck. CARDIOPULMONARY: No increased WOB. Speaking in clear  sentences. I:E ratio WNL.  MS: Moves all visible extremities without noticeable abnormality. PSYCH: Pleasant and cooperative, well-groomed. Speech normal rate and rhythm. Affect is appropriate. Insight and judgement are appropriate. Attention is focused, linear, and appropriate.  NEURO: CN grossly intact. Oriented as arrived to appointment on time with no prompting. Moves both UE equally.  SKIN: Area on the dorsum of her wright wrist int he shape of her fit bit - likely eroded, raised.  Depression screen [facility] 2/9 06/24/2018  03/13/2017 01/30/2017  Decreased Interest 0 0 0  Down, Depressed, Hopeless 0 0 0  PHQ - 2 Score 0 0 0    Assessment and Plan:   Talliyah was seen today for rash.  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    Marland Kitchen COVID-19 Education: The signs and symptoms of COVID-19 were discussed with the patient and how to seek care for testing if needed. The importance of social distancing was discussed today. . Reviewed expectations re: course of current medical issues. . Discussed self-management of symptoms. . Outlined signs and symptoms indicating need for more acute intervention. . Patient verbalized understanding and all questions were answered. Marland Kitchen Health Maintenance issues including appropriate healthy diet, exercise, and smoking avoidance were discussed with patient. . See orders for this visit as documented in the electronic medical record.  Arnette Norris, MD  Records requested if needed. Time spent: 15 minutes, of which >50% was spent in obtaining information about her symptoms, reviewing her previous labs, evaluations, and treatments, counseling her about her condition (please see the discussed topics above), and developing a plan to further investigate it; she had a number of questions which I addressed.

## 2019-06-02 NOTE — Assessment & Plan Note (Addendum)
New - likely due to her fit bit.  Advised NOT wearing it- it is several years old and likely eroded.  eRx sent for clobetasol to use twice daily for no more than 2 weeks without updating me.  Can also try an oral antihistamine.  Call or send my chart message prn if these symptoms worsen or fail to improve as anticipated.  The patient indicates understanding of these issues and agrees with the plan.

## 2020-02-06 ENCOUNTER — Other Ambulatory Visit: Payer: Self-pay | Admitting: Nurse Practitioner

## 2020-02-06 DIAGNOSIS — Z1231 Encounter for screening mammogram for malignant neoplasm of breast: Secondary | ICD-10-CM

## 2020-02-28 ENCOUNTER — Other Ambulatory Visit: Payer: Self-pay

## 2020-02-29 ENCOUNTER — Encounter: Payer: 59 | Admitting: Nurse Practitioner

## 2020-02-29 ENCOUNTER — Encounter: Payer: Self-pay | Admitting: Nurse Practitioner

## 2020-03-06 NOTE — Progress Notes (Signed)
This encounter was created in error - please disregard.

## 2020-03-16 ENCOUNTER — Other Ambulatory Visit: Payer: Self-pay

## 2020-03-16 ENCOUNTER — Ambulatory Visit
Admission: RE | Admit: 2020-03-16 | Discharge: 2020-03-16 | Disposition: A | Discharge status: Home / Self Care | Payer: 59 | Source: Ambulatory Visit | Attending: Nurse Practitioner | Admitting: Nurse Practitioner

## 2020-03-16 DIAGNOSIS — Z1231 Encounter for screening mammogram for malignant neoplasm of breast: Secondary | ICD-10-CM

## 2020-05-24 ENCOUNTER — Encounter: Payer: 59 | Admitting: Nurse Practitioner

## 2020-06-12 ENCOUNTER — Other Ambulatory Visit: Payer: Self-pay

## 2020-06-13 ENCOUNTER — Encounter: Payer: Self-pay | Admitting: Nurse Practitioner

## 2020-06-13 ENCOUNTER — Ambulatory Visit (INDEPENDENT_AMBULATORY_CARE_PROVIDER_SITE_OTHER): Payer: 59

## 2020-06-13 ENCOUNTER — Ambulatory Visit (INDEPENDENT_AMBULATORY_CARE_PROVIDER_SITE_OTHER): Payer: 59 | Admitting: Nurse Practitioner

## 2020-06-13 ENCOUNTER — Other Ambulatory Visit (HOSPITAL_COMMUNITY)
Admission: RE | Admit: 2020-06-13 | Discharge: 2020-06-13 | Disposition: A | Discharge status: Home / Self Care | Payer: 59 | Source: Ambulatory Visit | Attending: Nurse Practitioner | Admitting: Nurse Practitioner

## 2020-06-13 VITALS — BP 118/64 | HR 87 | Temp 96.0°F | Ht 62.5 in | Wt 171.0 lb

## 2020-06-13 DIAGNOSIS — G8929 Other chronic pain: Secondary | ICD-10-CM

## 2020-06-13 DIAGNOSIS — Z124 Encounter for screening for malignant neoplasm of cervix: Secondary | ICD-10-CM | POA: Insufficient documentation

## 2020-06-13 DIAGNOSIS — E782 Mixed hyperlipidemia: Secondary | ICD-10-CM | POA: Diagnosis not present

## 2020-06-13 DIAGNOSIS — M5442 Lumbago with sciatica, left side: Secondary | ICD-10-CM | POA: Diagnosis not present

## 2020-06-13 DIAGNOSIS — Z0001 Encounter for general adult medical examination with abnormal findings: Secondary | ICD-10-CM | POA: Insufficient documentation

## 2020-06-13 DIAGNOSIS — R739 Hyperglycemia, unspecified: Secondary | ICD-10-CM | POA: Diagnosis not present

## 2020-06-13 HISTORY — DX: Hyperglycemia, unspecified: R73.9

## 2020-06-13 LAB — LIPID PANEL
Cholesterol: 241 mg/dL — ABNORMAL HIGH (ref 0–200)
HDL: 74.1 mg/dL (ref >39.00)
LDL Cholesterol: 158 mg/dL — ABNORMAL HIGH (ref 0–99)
NonHDL: 166.83
Total CHOL/HDL Ratio: 3
Triglycerides: 46 mg/dL (ref 0.0–149.0)
VLDL: 9.2 mg/dL (ref 0.0–40.0)

## 2020-06-13 LAB — POCT URINE PREGNANCY: Preg Test, Ur: NEGATIVE

## 2020-06-13 LAB — COMPREHENSIVE METABOLIC PANEL
ALT: 24 U/L (ref 0–35)
AST: 23 U/L (ref 0–37)
Albumin: 4.4 g/dL (ref 3.5–5.2)
Alkaline Phosphatase: 49 U/L (ref 39–117)
BUN: 9 mg/dL (ref 6–23)
CO2: 25 mEq/L (ref 19–32)
Calcium: 9.6 mg/dL (ref 8.4–10.5)
Chloride: 103 mEq/L (ref 96–112)
Creatinine, Ser: 0.61 mg/dL (ref 0.40–1.20)
GFR: 110.71 mL/min (ref >60.00)
Glucose, Bld: 98 mg/dL (ref 70–99)
Potassium: 4 mEq/L (ref 3.5–5.1)
Sodium: 135 mEq/L (ref 135–145)
Total Bilirubin: 0.8 mg/dL (ref 0.2–1.2)
Total Protein: 7.3 g/dL (ref 6.0–8.3)

## 2020-06-13 LAB — HEMOGLOBIN A1C: Hgb A1c MFr Bld: 5.6 % (ref 4.6–6.5)

## 2020-06-13 LAB — CBC
HCT: 39.1 % (ref 36.0–46.0)
Hemoglobin: 13.2 g/dL (ref 12.0–15.0)
MCHC: 33.8 g/dL (ref 30.0–36.0)
MCV: 88.5 fl (ref 78.0–100.0)
Platelets: 255 10*3/uL (ref 150.0–400.0)
RBC: 4.41 Mil/uL (ref 3.87–5.11)
RDW: 13.7 % (ref 11.5–15.5)
WBC: 5.1 10*3/uL (ref 4.0–10.5)

## 2020-06-13 NOTE — Progress Notes (Signed)
Subjective:    Patient ID: Patty Garner, female    DOB: Jul 30, 1980, 39 y.o.   MRN: [id number]  Patient presents today for CPE   Back Pain This is a chronic problem. The current episode started more than 1 year ago. The problem occurs constantly. The problem is unchanged. The pain is present in the lumbar spine. The quality of the pain is described as aching and cramping. The pain radiates to the left thigh and left knee. The pain is moderate. The pain is the same all the time. The symptoms are aggravated by bending, twisting and position. Stiffness is present all day. Associated symptoms include leg pain. Pertinent negatives include no abdominal pain, bladder incontinence, bowel incontinence, chest pain, dysuria, fever, headaches, numbness, paresis, paresthesias, pelvic pain, perianal numbness, tingling, weakness or weight loss. Risk factors include obesity. She has tried home exercises and walking for the symptoms. The treatment provided mild relief.   Sexual History (orientation,birth control, marital status, STD):married, sexually active, regular menstrual cycle, denies need for STD screen, up to date with mammogram, declined breast exam.  Depression/Suicide: Depression screen [facility] 2/9 06/13/2020 02/29/2020 06/24/2018 03/13/2017 01/30/2017 12/04/2016 12/01/2016  Decreased Interest 0 0 0 0 0 0 0  Down, Depressed, Hopeless 0 0 0 0 0 0 0  PHQ - 2 Score 0 0 0 0 0 0 0   Vision:up to date  Dental:up to date  Immunizations: (TDAP, Hep C screen, Pneumovax, Influenza, zoster)  Health Maintenance  Topic Date Due  . Pap Smear  08/27/2018  . COVID-19 Vaccine (1) 06/29/2020*  . Flu Shot  10/18/2020*  .  Hepatitis C: One time screening is recommended by Center for Disease Control  (CDC) for  adults born from [birth year] through 1967.   06/13/2021*  . Tetanus Vaccine  08/26/2027  . HIV Screening  Completed  *Topic was postponed. The date shown is not the original due date.   Diet:regular  Weight:  Wt  Readings from Last 3 Encounters:  06/13/20 171 lb (77.6 kg)  02/29/20 178 lb 12.8 oz (81.1 kg)  03/09/19 181 lb 3.2 oz (82.2 kg)   Fall Risk: Fall Risk  06/13/2020 02/29/2020 03/09/2019 06/24/2018 03/13/2017 01/30/2017 12/04/2016  Falls in the past year? 0 0 0 0 No No No  Number falls in past yr: 0 0 - - - - -  Injury with Fall? 0 0 - - - - -  Follow up - - Falls evaluation completed - - - -   Medications and allergies reviewed with patient and updated if appropriate.  Patient Active Problem List   Diagnosis Date Noted  . 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 HLD (hyperlipidemia) 06/02/2016  . Obesity 08/28/2015  . Allergic rhinitis 10/26/2014  . Contraceptive management 06/07/2014    Current Outpatient Medications on File Prior to Visit  Medication Sig Dispense Refill  . cetirizine (ZYRTEC) 10 MG tablet Take 1 tablet (10 mg total) by mouth daily. 30 tablet 11  . Multiple Vitamin (MULTIVITAMIN) tablet Take 1 tablet by mouth daily.     No current facility-administered medications on file prior to visit.    History reviewed. No pertinent past medical history.  History reviewed. No pertinent surgical history.  Social History   Socioeconomic History  . Marital status: Married    Spouse name: Not on file  . Number of children: Not on file  . Years of education: Not on file  . Highest education level: Not on file  Occupational History  .  Not on file  Tobacco Use  . Smoking status: Former Research scientist (life sciences)  . Smokeless tobacco: Never Used  Vaping Use  . Vaping Use: Never used  Substance and Sexual Activity  . Alcohol use: Yes    Alcohol/week: 8.0 standard drinks    Types: 4 Cans of beer, 4 Shots of liquor per week    Comment: moderate  . Drug use: No  . Sexual activity: Not on file  Other Topics Concern  . Not on file  Social History Narrative  . Not on file   Social Determinants of Health   Financial Resource Strain:   . Difficulty of Paying Living Expenses: Not on  file  Food Insecurity:   . Worried About Charity fundraiser in the Last Year: Not on file  . Ran Out of Food in the Last Year: Not on file  Transportation Needs:   . Lack of Transportation (Medical): Not on file  . Lack of Transportation (Non-Medical): Not on file  Physical Activity:   . Days of Exercise per Week: Not on file  . Minutes of Exercise per Session: Not on file  Stress:   . Feeling of Stress : Not on file  Social Connections:   . Frequency of Communication with Friends and Family: Not on file  . Frequency of Social Gatherings with Friends and Family: Not on file  . Attends Religious Services: Not on file  . Active Member of Clubs or Organizations: Not on file  . Attends Archivist Meetings: Not on file  . Marital Status: Not on file    Family History  Problem Relation Age of Onset  . Diabetes Maternal Grandmother   . Heart disease Maternal Grandmother   . Hypertension Maternal Grandmother   . Breast cancer Neg Hx         Review of Systems  Constitutional: Negative for fever, malaise/fatigue and weight loss.  HENT: Negative for congestion and sore throat.   Eyes:       Negative for visual changes  Respiratory: Negative for cough and shortness of breath.   Cardiovascular: Negative for chest pain, palpitations and leg swelling.  Gastrointestinal: Negative for abdominal pain, blood in stool, bowel incontinence, constipation, diarrhea and heartburn.  Genitourinary: Negative for bladder incontinence, dysuria, frequency, pelvic pain and urgency.  Musculoskeletal: Positive for back pain. Negative for falls, joint pain and myalgias.  Skin: Negative for rash.  Neurological: Negative for dizziness, tingling, sensory change, weakness, numbness, headaches and paresthesias.  Endo/Heme/Allergies: Does not bruise/bleed easily.  Psychiatric/Behavioral: Negative for depression, substance abuse and suicidal ideas. The patient is not nervous/anxious.    Objective:    Vitals:   06/13/20 0935  BP: 118/64  Pulse: 87  Temp: (!) 96 F (35.6 C)  SpO2: 100%    Body mass index is 30.78 kg/m.   Physical Examination:  Physical Exam Vitals reviewed. Exam conducted with a chaperone present.  Constitutional:      General: She is not in acute distress.    Appearance: She is obese.  HENT:     Right Ear: External ear normal.     Left Ear: External ear normal.     Nose: Nose normal.     Mouth/Throat:     Pharynx: No oropharyngeal exudate.  Eyes:     General: No scleral icterus.    Conjunctiva/sclera: Conjunctivae normal.     Pupils: Pupils are equal, round, and reactive to light.  Neck:     Thyroid: No thyromegaly.  Cardiovascular:  Rate and Rhythm: Normal rate and regular rhythm.     Heart sounds: Normal heart sounds.  Pulmonary:     Effort: Pulmonary effort is normal.     Breath sounds: Normal breath sounds.  Chest:     Chest wall: No tenderness.  Abdominal:     General: Bowel sounds are normal. There is no distension.     Palpations: Abdomen is soft.     Tenderness: There is no abdominal tenderness.     Hernia: There is no hernia in the left inguinal area or right inguinal area.  Genitourinary:    General: Normal vulva.     Labia:        Right: No rash or tenderness.        Left: No rash or tenderness.      Vagina: Normal. No vaginal discharge.     Cervix: Normal.     Uterus: Normal.      Adnexa: Right adnexa normal and left adnexa normal.     Rectum: No external hemorrhoid.  Musculoskeletal:        General: Tenderness present. No swelling, deformity or signs of injury. Normal range of motion.     Cervical back: Normal range of motion and neck supple.     Lumbar back: Normal.     Right hip: Normal.     Left hip: Normal.     Right upper leg: Normal.     Left upper leg: Normal.     Right lower leg: Normal. No edema.     Left lower leg: Normal. No edema.  Lymphadenopathy:     Cervical: No cervical adenopathy.     Lower  Body: No right inguinal adenopathy. No left inguinal adenopathy.  Skin:    General: Skin is warm and dry.  Neurological:     Mental Status: She is alert and oriented to person, place, and time.  Psychiatric:        Judgment: Judgment normal.    ASSESSMENT and PLAN: This visit occurred during the SARS-CoV-2 public health emergency.  Safety protocols were in place, including screening questions prior to the visit, additional usage of staff PPE, and extensive cleaning of exam room while observing appropriate contact time as indicated for disinfecting solutions.   Jacky was seen today for establish care.  Diagnoses and all orders for this visit:  Encounter for preventative adult health care exam with abnormal findings -     Comprehensive metabolic panel -     CBC -     Cytology - PAP( [place]) -     POCT urine pregnancy  Mixed hyperlipidemia -     Lipid panel  Hyperglycemia -     Hemoglobin A1c  Encounter for Papanicolaou smear for cervical cancer screening -     Cytology - PAP( [place])  Chronic bilateral low back pain with left-sided sciatica -     Ambulatory referral to Physical Therapy -     DG Lumbar Spine Complete        Problem List Items Addressed This Visit      Other   HLD (hyperlipidemia)   Relevant Orders   Lipid panel (Completed)    Other Visit Diagnoses    Encounter for preventative adult health care exam with abnormal findings    -  Primary   Relevant Orders   Comprehensive metabolic panel (Completed)   CBC (Completed)   Cytology - PAP( [place])   POCT urine pregnancy (Completed)   Hyperglycemia  Relevant Orders   Hemoglobin A1c (Completed)   Encounter for Papanicolaou smear for cervical cancer screening       Relevant Orders   Cytology - PAP( [place])   Chronic bilateral low back pain with left-sided sciatica       Relevant Orders   Ambulatory referral to Physical Therapy   DG Lumbar Spine Complete      Follow up:  Return in about 1 year (around 06/13/2021) for CPE (fasting).  Wilfred Lacy, NP

## 2020-06-13 NOTE — Patient Instructions (Signed)
Go to lab for blood draw and x-ray  You will be contacted to schedule appt for outpatient physical therapy.   Medical Screening Exam  A medical screening exam helps determine whether or not you need immediate medical treatment. This type of exam may be done in the emergency department, an urgent care setting, or your health care provider's office. During the exam, a health care provider does a short physical exam and asks about your medical history to assess:  Your current symptoms.  Your overall health. Depending on your symptoms, you may need additional tests. What are the possible outcomes of a medical screening exam? Your medical screening exam may determine that:  You do not need emergency treatment at this time.  You need treatment right away.  You need to be transferred to another medical center.  You need to have more tests. A medical specialist may be consulted if necessary. When should I seek medical care? If you have a regular health care provider, make an appointment for a follow-up visit with him or her. If you do not have a regular health care provider, ask about resources in your community. Get help right away if:  Your condition gets worse or you develop new or troubling symptoms before you see your health care provider. If this occurs, go to an emergency department right away. In an emergency:  Call 911 or have someone drive you to the nearest hospital.  Do not drive yourself. Summary  A medical screening exam helps determine whether or not you need immediate medical treatment.  During the exam, a health care provider does a short physical exam and asks about your current symptoms and overall health.  More tests may be ordered during the exam.  You may need to be transferred to another medical center. This information is not intended to replace advice given to you by your health care provider. Make sure you discuss any questions you have with your health  care provider. Document Revised: 10/29/2018 Document Reviewed: 02/02/2018 Elsevier Patient Education  [place].

## 2020-06-19 ENCOUNTER — Encounter: Payer: Self-pay | Admitting: Nurse Practitioner

## 2020-06-19 LAB — CYTOLOGY - PAP
Comment: NEGATIVE
Diagnosis: NEGATIVE
High risk HPV: NEGATIVE

## 2020-07-02 ENCOUNTER — Encounter: Payer: 59 | Admitting: Nurse Practitioner

## 2020-07-04 ENCOUNTER — Other Ambulatory Visit: Payer: Self-pay

## 2020-07-04 ENCOUNTER — Ambulatory Visit: Payer: 59 | Attending: Nurse Practitioner

## 2020-07-04 DIAGNOSIS — M51379 Other intervertebral disc degeneration, lumbosacral region without mention of lumbar back pain or lower extremity pain: Secondary | ICD-10-CM

## 2020-07-04 DIAGNOSIS — M4310 Spondylolisthesis, site unspecified: Secondary | ICD-10-CM

## 2020-07-04 DIAGNOSIS — M5137 Other intervertebral disc degeneration, lumbosacral region: Secondary | ICD-10-CM

## 2020-07-04 DIAGNOSIS — M5442 Lumbago with sciatica, left side: Secondary | ICD-10-CM | POA: Insufficient documentation

## 2020-07-04 DIAGNOSIS — G8929 Other chronic pain: Secondary | ICD-10-CM

## 2020-07-05 NOTE — Therapy (Signed)
[place], [place], [address] Phone: [phone]   Fax:  [phone]  Physical Therapy Evaluation  Patient Details  Name: Patty Garner MRN: [id number] Date of Birth: 11-Aug-1980 Referring Provider (PT): Nche, Charlene Brooke, NP   Encounter Date: 07/04/2020   PT End of Session - 07/05/20 0726    Visit Number 1    Number of Visits 7    Date for PT Re-Evaluation 08/25/20    Authorization Type [employer] OTHER    Progress Note Due on Visit 10    PT Start Time 1219    PT Stop Time 1305    PT Time Calculation (min) 46 min    Activity Tolerance Patient tolerated treatment well    Behavior During Therapy [facility] for tasks assessed/performed           Past Medical History:  Diagnosis Date  . Hyperglycemia 06/13/2020    History reviewed. No pertinent surgical history.  There were no vitals filed for this visit.    Subjective Assessment - 07/04/20 1229    Subjective Pt reports no specific incident causing her low back pain, but it developed approx. 3 years ago when she was weighing 227 lbs. She has since lost weight to 167 lbs, but the low back pain has remained. The low back is constant c intermittent L LE pain of post/lat gluteal and thigh. Pt states at times she experiences significant episodes of intermittent muscle spasms. As part of lossing weight, she has become very active with exercise, completing HIT 4-5x a week and walking 6 days a week. She reports pain it does not limit her activity the majority of the time. The pain is aggrevating.    How long can you sit comfortably? 1 hour    How long can you stand comfortably? No limitation stands at work    How long can you walk comfortably? No limitation    Diagnostic tests 08/14/19: IMPRESSION:  1. No evidence of acute fracture.  2. Moderate degenerative disc disease at L5-S1 with mild  retrolisthesis, likely degenerative.    Patient Stated Goals For  the pain to resolve.    Currently in Pain? Yes    Pain Score 4    Pain range is 3-8/10   Pain Location Back    Pain Orientation Left;Posterior;Right    Pain Descriptors / Indicators Aching;Nagging;Spasm    Pain Type Chronic pain    Pain Radiating Towards L post/lat gluteal and thigh    Pain Onset More than a month ago    Pain Frequency Constant    Aggravating Factors  Sleep- when I wake up    Pain Relieving Factors Child's pose    Effect of Pain on Daily Activities minimal impact with pt being as active as she wants to be most of the time              [facility] PT Assessment - 07/05/20 0001      Assessment   Medical Diagnosis Chronic bilateral low back pain with left-sided sciatica    Referring Provider (PT) Nche, Charlene Brooke, NP    Onset Date/Surgical Date --   3 years   Hand Dominance Right    Prior Therapy No      Precautions   Precautions None      Restrictions   Weight Bearing Restrictions No      Balance Screen   Has the patient fallen in the past 6 months No  Home Environment   Living Environment Private residence    Living Arrangements Spouse/significant other    Type of [place] to enter    Entrance Stairs-Number of Steps 2 flights    Entrance [place] One level      Prior Function   Level of Independence Independent    Vocation Full time employment    Mudlogger; health coach      Cognition   Overall Cognitive Status Within Functional Limits for tasks assessed      Observation/Other Assessments   Focus on Therapeutic Outcomes (FOTO)  83% functional ability      Sensation   Light Touch Appears Intact      Posture/Postural Control   Posture/Postural Control Postural limitations    Postural Limitations Increased lumbar lordosis;Decreased thoracic kyphosis      ROM / Strength   AROM / PROM / Strength AROM;Strength      AROM   Overall AROM Comments Trunk AROMs were  WNLs with low back pain provoked with L and R side bending and ext. Pain was reduced with forward bending      Strength   Overall Strength Comments LE myotomal screen was negative      Palpation   Palpation comment iliac crest, PSIS, ASIS were found to be level      Special Tests    Special Tests Lumbar;Sacrolliac Tests    Lumbar Tests Slump Test    Sacroiliac Tests  Pelvic Compression      Slump test   Findings Negative      Pelvic Dictraction   Findings Negative      Pelvic Compression   Findings Negative      Transfers   Transfers Sit to Stand;Stand to Sit    Sit to Stand 7: Independent      Ambulation/Gait   Ambulation/Gait Yes    Ambulation/Gait Assistance 7: Independent    Gait Pattern Within Functional Limits;Step-to pattern                      Objective measurements completed on examination: See above findings.               PT Education - 07/05/20 1194    Education Details Eval findings, POC, sitting c neutral spine, standing c foot support, HEP- Trunk flexion for stretching, and abd and back strengthening for stability.    Person(s) Educated Patient    Methods Explanation;Demonstration;Tactile cues;Verbal cues;Handout;Other (comment)    Comprehension Verbalized understanding;Returned demonstration;Verbal cues required;Tactile cues required;Need further instruction            PT Short Term Goals - 07/05/20 0741      PT SHORT TERM GOAL #1   Title Pt will be Ind in an initial HEP    Baseline Starte don eval    Status New    Target Date 07/26/20      PT SHORT TERM GOAL #2   Title Pt will voice uderstanding of measures to assist in the reduction of her low back pain    Status New    Target Date 07/26/20             PT Long Term Goals - 07/05/20 0743      PT LONG TERM GOAL #1   Title Pt will be Ind in a final HEP to maintain or progress the achieved LOF    Status New  Target Date 08/25/20      PT LONG TERM GOAL #2    Title Pt will report improved low back pain with daily activities, recreational activites and sleep to 4/10 or less    Baseline 3-8/10 pain range    Status New    Target Date 08/25/20                  Plan - 07/05/20 0732    Clinical Impression Statement Pt presents with chronic Hx of low back pain c intermittent L upper leg pain.. X-rays reveal DDD L5-S1 c mild retrolisthesis. Pt responded well to initial exs for trunk flexion flexibilty and strengthening for the abdominals and back. Pt rated her low back pain a 2/10 at the end of the session. Pt will benefit from PT 1w6 for back care ed, ther ex, and modalities and manual therapy as indicated to reduce pain and maxize function.    Personal Factors and Comorbidities Time since onset of injury/illness/exacerbation    Examination-Activity Limitations Sleep    Stability/Clinical Decision Making Stable/Uncomplicated    Clinical Decision Making Low    Rehab Potential Excellent    PT Frequency 1x / week    PT Duration 6 weeks    PT Treatment/Interventions ADLs/Self Care Home Management;Electrical Stimulation;Iontophoresis 4mg /ml Dexamethasone;Moist Heat;Traction;Ultrasound;Therapeutic exercise;Therapeutic activities;Functional mobility training;Patient/family education;Manual techniques;Passive range of motion;Dry needling;Taping    PT Next Visit Plan Assess response to HEP and education    PT Home Exercise Plan [id number]    Consulted and Agree with Plan of Care Patient           Patient will benefit from skilled therapeutic intervention in order to improve the following deficits and impairments:  Postural dysfunction,Pain  Visit Diagnosis: Chronic bilateral low back pain with left-sided sciatica - Plan: PT plan of care cert/re-cert  Retrolisthesis of vertebrae - Plan: PT plan of care cert/re-cert  DDD (degenerative disc disease), lumbosacral - Plan: PT plan of care cert/re-cert     Problem List Patient Active Problem List    Diagnosis Date Noted  . Chronic bilateral low back pain with left-sided sciatica 06/13/2020  . 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 HLD (hyperlipidemia) 06/02/2016  . Obesity 08/28/2015  . Allergic rhinitis 10/26/2014  . Contraceptive management 06/07/2014    Gar Ponto MS, PT 07/05/20 7:59 AM  [facility] [address] [place], [place], [address] Phone: [phone]   Fax:  [phone]  Name: PAIZLEIGH WILDS MRN: [id number] Date of Birth: May 07, 1981

## 2020-07-18 ENCOUNTER — Ambulatory Visit: Payer: 59 | Admitting: Physical Therapy

## 2020-07-25 ENCOUNTER — Ambulatory Visit: Payer: 59 | Admitting: Physical Therapy

## 2020-08-01 ENCOUNTER — Other Ambulatory Visit: Payer: Self-pay

## 2020-08-01 ENCOUNTER — Ambulatory Visit: Payer: 59 | Attending: Nurse Practitioner

## 2020-08-01 DIAGNOSIS — M5442 Lumbago with sciatica, left side: Secondary | ICD-10-CM | POA: Diagnosis present

## 2020-08-01 DIAGNOSIS — M4310 Spondylolisthesis, site unspecified: Secondary | ICD-10-CM | POA: Diagnosis present

## 2020-08-01 DIAGNOSIS — M5137 Other intervertebral disc degeneration, lumbosacral region: Secondary | ICD-10-CM | POA: Diagnosis present

## 2020-08-01 DIAGNOSIS — G8929 Other chronic pain: Secondary | ICD-10-CM | POA: Insufficient documentation

## 2020-08-01 NOTE — Therapy (Signed)
[place], [place], [address] Phone: [phone]   Fax:  [phone]  Physical Therapy Treatment  Patient Details  Name: Patty Garner MRN: [id number] Date of Birth: Feb 27, 1981 Referring Provider (PT): Nche, Charlene Brooke, NP   Encounter Date: 08/01/2020   PT End of Session - 08/01/20 1352    Visit Number 2    Number of Visits 7    Date for PT Re-Evaluation 08/25/20    Authorization Type [employer] OTHER    Progress Note Due on Visit 10    PT Start Time 1006    PT Stop Time 1046    PT Time Calculation (min) 40 min    Activity Tolerance Patient tolerated treatment well    Behavior During Therapy [facility] for tasks assessed/performed           Past Medical History:  Diagnosis Date  . Hyperglycemia 06/13/2020    History reviewed. No pertinent surgical history.  There were no vitals filed for this visit.   Subjective Assessment - 08/01/20 1011    Subjective Pt reports recommendations for foot step for standing at work, and the strengthening and stretching exs have all been helpful. pt states some of her personal workouts are not the best for her low back and she has not completed her HEP as consistently as she would like.    How long can you sit comfortably? 1 hour    How long can you stand comfortably? No limitation stands at work    How long can you walk comfortably? No limitation    Diagnostic tests 08/14/19: IMPRESSION:  1. No evidence of acute fracture.  2. Moderate degenerative disc disease at L5-S1 with mild  retrolisthesis, likely degenerative.    Patient Stated Goals For the pain to resolve.    Currently in Pain? Yes    Pain Score 7     Pain Location Back    Pain Orientation Left;Right;Posterior    Pain Descriptors / Indicators Aching;Nagging;Spasm    Pain Type Chronic pain    Pain Radiating Towards L post/lat gluteal and thigh    Pain Onset More than a month ago    Pain Frequency  Constant    Aggravating Factors  Sllep, when I wake up    Pain Relieving Factors Child's pose    Effect of Pain on Daily Activities minimal impact with pt being as active as she wants to be most of the time                             [facility] Adult PT Treatment/Exercise - 08/01/20 0001      Exercises   Exercises Lumbar      Lumbar Exercises: Supine   Pelvic Tilt 10 reps   3 sec   Bent Knee Raise 10 reps    Dead Bug 20 reps      Lumbar Exercises: Quadruped   Opposite Arm/Leg Raise Right arm/Left leg;Left arm/Right leg;20 reps;2 seconds    Opposite Arm/Leg Raise Limitations not past neutral    Other Quadruped Lumbar Exercises Lt, Rt; Bent knee lifts    Other Quadruped Lumbar Exercises not past neutral                  PT Education - 08/01/20 1351    Education Details HEP, education for neutral spine for reduction of low back pain vs. back ext which will increase pain.  Person(s) Educated Patient    Methods Explanation    Comprehension Verbalized understanding            PT Short Term Goals - 07/05/20 0741      PT SHORT TERM GOAL #1   Title Pt will be Ind in an initial HEP    Baseline Starte don eval    Status New    Target Date 07/26/20      PT SHORT TERM GOAL #2   Title Pt will voice uderstanding of measures to assist in the reduction of her low back pain    Status New    Target Date 07/26/20             PT Long Term Goals - 07/05/20 0743      PT LONG TERM GOAL #1   Title Pt will be Ind in a final HEP to maintain or progress the achieved LOF    Status New    Target Date 08/25/20      PT LONG TERM GOAL #2   Title Pt will report improved low back pain with daily activities, recreational activites and sleep to 4/10 or less    Baseline 3-8/10 pain range    Status New    Target Date 08/25/20                 Plan - 08/01/20 1353    Clinical Impression Statement PT was completed to reinfoce core strengthening and a neutral  spine for the reduction of low back pain. Pt has followed recommendations for positioning and exs, strengthening and stretching, with favorable results. Pt feels she may be completing her own personal workouts in a manner which is aggrevating her pain. Pt reports understanding a neutral spine posture and will try to follow this concept with her own workouts. Following today's session the pt reported her low back was feeling better.    Personal Factors and Comorbidities Time since onset of injury/illness/exacerbation    Examination-Activity Limitations Sleep    Stability/Clinical Decision Making Stable/Uncomplicated    Clinical Decision Making Low    Rehab Potential Excellent    PT Frequency 1x / week    PT Duration 6 weeks    PT Treatment/Interventions ADLs/Self Care Home Management;Electrical Stimulation;Iontophoresis 4mg /ml Dexamethasone;Moist Heat;Traction;Ultrasound;Therapeutic exercise;Therapeutic activities;Functional mobility training;Patient/family education;Manual techniques;Passive range of motion;Dry needling;Taping    PT Next Visit Plan Continue to assess response to HEP and education    PT Home Exercise Plan [id number]    Consulted and Agree with Plan of Care Patient           Patient will benefit from skilled therapeutic intervention in order to improve the following deficits and impairments:  Postural dysfunction,Pain  Visit Diagnosis: Chronic bilateral low back pain with left-sided sciatica  Retrolisthesis of vertebrae  DDD (degenerative disc disease), lumbosacral     Problem List Patient Active Problem List   Diagnosis Date Noted  . Chronic bilateral low back pain with left-sided sciatica 06/13/2020  . 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 HLD (hyperlipidemia) 06/02/2016  . Obesity 08/28/2015  . Allergic rhinitis 10/26/2014  . Contraceptive management 06/07/2014    Gar Ponto MS, PT 08/01/20 2:03 PM  [place]  [facility] [address] [place], [place], [address] Phone: [phone]   Fax:  [phone]  Name: ESME DURKIN MRN: [id number] Date of Birth: May 10, 1981

## 2020-08-08 ENCOUNTER — Ambulatory Visit: Payer: 59 | Admitting: Physical Therapy

## 2020-08-08 ENCOUNTER — Encounter: Payer: Self-pay | Admitting: Physical Therapy

## 2020-08-08 ENCOUNTER — Other Ambulatory Visit: Payer: Self-pay

## 2020-08-08 DIAGNOSIS — M51379 Other intervertebral disc degeneration, lumbosacral region without mention of lumbar back pain or lower extremity pain: Secondary | ICD-10-CM

## 2020-08-08 DIAGNOSIS — G8929 Other chronic pain: Secondary | ICD-10-CM

## 2020-08-08 DIAGNOSIS — M4310 Spondylolisthesis, site unspecified: Secondary | ICD-10-CM

## 2020-08-08 DIAGNOSIS — M5442 Lumbago with sciatica, left side: Secondary | ICD-10-CM | POA: Diagnosis not present

## 2020-08-08 DIAGNOSIS — M5137 Other intervertebral disc degeneration, lumbosacral region: Secondary | ICD-10-CM

## 2020-08-08 NOTE — Therapy (Signed)
[place], [place], [address] Phone: [phone]   Fax:  [phone]  Physical Therapy Treatment  Patient Details  Name: Patty Garner MRN: [id number] Date of Birth: April 10, 1981 Referring Provider (PT): Nche, Charlene Brooke, NP   Encounter Date: 08/08/2020   PT End of Session - 08/08/20 0906    Visit Number 3    Number of Visits 7    Authorization Type [employer] OTHER    PT Start Time 0801    PT Stop Time 0846    PT Time Calculation (min) 45 min    Activity Tolerance Patient tolerated treatment well    Behavior During Therapy [facility] for tasks assessed/performed           Past Medical History:  Diagnosis Date  . Hyperglycemia 06/13/2020    History reviewed. No pertinent surgical history.  There were no vitals filed for this visit.   Subjective Assessment - 08/08/20 0836    Subjective Patient reports the stretches are helping. She is in ablut the same pain this morning. She reports she needs to find more time to work on the [place]. She reports she slept through the night for the first time.    How long can you sit comfortably? 1 hour    How long can you stand comfortably? No limitation stands at work    How long can you walk comfortably? No limitation    Diagnostic tests 08/14/19: IMPRESSION:  1. No evidence of acute fracture.  2. Moderate degenerative disc disease at L5-S1 with mild  retrolisthesis, likely degenerative.    Patient Stated Goals For the pain to resolve.    Currently in Pain? Yes    Pain Score 5     Pain Location Back    Pain Orientation Right;Left    Pain Descriptors / Indicators Aching    Pain Type Chronic pain    Pain Onset More than a month ago    Pain Frequency Constant    Aggravating Factors  sitting,    Pain Relieving Factors stretches    Effect of Pain on Daily Activities continuous pain                             [facility] Adult PT  Treatment/Exercise - 08/08/20 0001      Lumbar Exercises: Stretches   Active Hamstring Stretch Limitations 3x20 sec hold reviewed in sitting    Piriformis Stretch Limitations 3x20 given for seated HEP    Other Lumbar Stretch Exercise given tennis ball for trigger point release and post needle soreness      Lumbar Exercises: Standing   Other Standing Lumbar Exercises row green with breathing; discussed concept of transverse abdominal breathing for her to work on with all ovf her exercises.      Lumbar Exercises: Supine   Bent Knee Raise --    Dead Bug --      Manual Therapy   Manual Therapy Soft tissue mobilization;Passive ROM;Manual Traction    Manual therapy comments skilled palpation of trigger points    Soft tissue mobilization to lower lumbar spine and gluteals    Passive ROM reviewed passive motion in all plains. Full hip motion noted    Manual Traction LAD grade III and Iv ocillations            Trigger Point Dry Needling - 08/08/20 0001    Consent Given? Yes    Education Handout  Provided Yes    Muscles Treated Back/Hip Gluteus medius;Lumbar multifidi    Dry Needling Comments 2 spots in right gluteal; 1 spot in leftL5 parapinal 1 spot in L5 and L4 right .30x50    Gluteus Medius Response Twitch response elicited;Palpable increased muscle length    Lumbar multifidi Response Twitch response elicited;Palpable increased muscle length                PT Education - 08/08/20 0906    Education Details reviewed HEP and symptom management    Person(s) Educated Patient    Methods Explanation;Demonstration;Tactile cues;Verbal cues    Comprehension Verbalized understanding;Returned demonstration;Verbal cues required;Tactile cues required            PT Short Term Goals - 07/05/20 0741      PT SHORT TERM GOAL #1   Title Pt will be Ind in an initial HEP    Baseline Starte don eval    Status New    Target Date 07/26/20      PT SHORT TERM GOAL #2   Title Pt will voice  uderstanding of measures to assist in the reduction of her low back pain    Status New    Target Date 07/26/20             PT Long Term Goals - 07/05/20 0743      PT LONG TERM GOAL #1   Title Pt will be Ind in a final HEP to maintain or progress the achieved LOF    Status New    Target Date 08/25/20      PT LONG TERM GOAL #2   Title Pt will report improved low back pain with daily activities, recreational activites and sleep to 4/10 or less    Baseline 3-8/10 pain range    Status New    Target Date 08/25/20                 Plan - 08/08/20 0908    Clinical Impression Statement Patient was interested in trigger point dry needling. She had several trigger points in her gluteals and lower lumbar spine. she had a great tiewtch respose in several spots. Patient was given a tennis ball for post needle soreness. Therapy also reviewed stretches that she can do in sitting. She tolerated well/. She reporte  a significant improvement in pain with treatment. She was advised to use her stretches as her pain returned to foigure out which ones are most beneficial. Therapy gave her a standing band row and explaend to her how all exercises can be core exercises if she breathes right.    Personal Factors and Comorbidities Time since onset of injury/illness/exacerbation    Examination-Activity Limitations Sleep    Stability/Clinical Decision Making Stable/Uncomplicated    Clinical Decision Making Low    Rehab Potential Excellent    PT Frequency 1x / week    PT Duration 6 weeks    PT Treatment/Interventions ADLs/Self Care Home Management;Electrical Stimulation;Iontophoresis 4mg /ml Dexamethasone;Moist Heat;Traction;Ultrasound;Therapeutic exercise;Therapeutic activities;Functional mobility training;Patient/family education;Manual techniques;Passive range of motion;Dry needling;Taping    PT Next Visit Plan Continue to assess response to HEP and education    PT Home Exercise Plan [id number]     Consulted and Agree with Plan of Care Patient           Patient will benefit from skilled therapeutic intervention in order to improve the following deficits and impairments:  Postural dysfunction,Pain  Visit Diagnosis: Chronic bilateral low back pain with left-sided sciatica  Retrolisthesis  of vertebrae  DDD (degenerative disc disease), lumbosacral     Problem List Patient Active Problem List   Diagnosis Date Noted  . Chronic bilateral low back pain with left-sided sciatica 06/13/2020  . 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 HLD (hyperlipidemia) 06/02/2016  . Obesity 08/28/2015  . Allergic rhinitis 10/26/2014  . Contraceptive management 06/07/2014    Carney Living  PT DPT  08/08/2020, 1:20 PM  [facility] [address] [place], [place], [address] Phone: [phone]   Fax:  [phone]  Name: Patty Garner MRN: [id number] Date of Birth: 1980/09/25

## 2020-08-15 ENCOUNTER — Encounter: Payer: Self-pay | Admitting: Physical Therapy

## 2020-08-15 ENCOUNTER — Ambulatory Visit: Payer: 59 | Admitting: Physical Therapy

## 2020-08-15 ENCOUNTER — Other Ambulatory Visit: Payer: Self-pay

## 2020-08-15 DIAGNOSIS — M5137 Other intervertebral disc degeneration, lumbosacral region: Secondary | ICD-10-CM

## 2020-08-15 DIAGNOSIS — M5442 Lumbago with sciatica, left side: Secondary | ICD-10-CM | POA: Diagnosis not present

## 2020-08-15 DIAGNOSIS — G8929 Other chronic pain: Secondary | ICD-10-CM

## 2020-08-15 DIAGNOSIS — M4310 Spondylolisthesis, site unspecified: Secondary | ICD-10-CM

## 2020-08-15 NOTE — Therapy (Signed)
[place], [place], [address] Phone: [phone]   Fax:  [phone]  Physical Therapy Treatment  Patient Details  Name: Patty Garner MRN: [id number] Date of Birth: [birth year] Referring Provider (PT): Nche, Charlene Brooke, NP   Encounter Date: 08/15/2020   PT End of Session - 08/15/20 0930    Visit Number 4    Number of Visits 7    Date for PT Re-Evaluation 08/25/20    Authorization Type [employer] OTHER    PT Start Time 0830    PT Stop Time 0915    PT Time Calculation (min) 45 min    Activity Tolerance Patient tolerated treatment well    Behavior During Therapy [facility] for tasks assessed/performed           Past Medical History:  Diagnosis Date  . Hyperglycemia 06/13/2020    History reviewed. No pertinent surgical history.  There were no vitals filed for this visit.   Subjective Assessment - 08/15/20 0921    Subjective THe needling was amazing.  Very little pain (0-1/10)  pain since last week. Such a difference.    Diagnostic tests 08/14/19: IMPRESSION:  1. No evidence of acute fracture.  2. Moderate degenerative disc disease at L5-S1 with mild  retrolisthesis, likely degenerative.    Currently in Pain? No/denies             [facility] Adult PT Treatment/Exercise - 08/15/20 0001      Self-Care   Self-Care Other Self-Care Comments;Posture    Posture guidance for neutral spine, safety with core routine      Lumbar Exercises: Stretches   Active Hamstring Stretch 3 reps    Active Hamstring Stretch Limitations strap and seated    Lower Trunk Rotation Limitations legs crossed x 3 each side , 15 sec hold    Piriformis Stretch 3 reps;20 seconds    Other Lumbar Stretch Exercise 3 way hip x 1 (ITB, adductor and hamstring )      Lumbar Exercises: Aerobic   Nustep 6 min L6 UE and LE      Lumbar Exercises: Supine   Bent Knee Raise 15 reps    Bent Knee Raise Limitations 2 sets, 1 with 10 lbs  static wgt    Dead Bug 10 reps    Bridge 20 reps    Bridge Limitations 10 lbs    Bridge with March 10 reps    Bridge with [employer] Limitations cues for controlling    Other Supine Lumbar Exercises lat pull over 2 x 10 with 10 lbs, hooklying then table top 90/90 , cue for spine extension      Lumbar Exercises: Quadruped   Opposite Arm/Leg Raise 10 reps      Manual Therapy   Soft tissue mobilization R lumbar and quadratus lumborum in sidelying stretch position                    PT Short Term Goals - 08/15/20 1203      PT SHORT TERM GOAL #1   Title Pt will be Ind in an initial HEP    Status Achieved      PT SHORT TERM GOAL #2   Title Pt will voice uderstanding of measures to assist in the reduction of her low back pain    Status Achieved             PT Long Term Goals - 08/15/20 1203  PT LONG TERM GOAL #1   Title Pt will be Ind in a final HEP to maintain or progress the achieved LOF    Status On-going      PT LONG TERM GOAL #2   Title Pt will report improved low back pain with daily activities, recreational activites and sleep to 4/10 or less    Baseline met for the past week , keep for consistency    Status On-going                 Plan - 08/15/20 0931    Clinical Impression Statement Patient with positive result from 1 dry needling session, reporting barely any pain in the past week. Discussed progressions and modifications for core exercises as she continues independently and with her personal trainer. Needed cues to control motion and move more slowly to engage properly without arching her back in supine. No increased pain but does exhibit muscular tension in Rt lumbar that may benefit from another dry needling session.    PT Treatment/Interventions ADLs/Self Care Home Management;Electrical Stimulation;Iontophoresis 33m/ml Dexamethasone;Moist Heat;Traction;Ultrasound;Therapeutic exercise;Therapeutic activities;Functional mobility  training;Patient/family education;Manual techniques;Passive range of motion;Dry needling;Taping    PT Next Visit Plan Reinforce core, HEP and manual, dry needling as needed.    PT Home Exercise Plan [id number]   Consulted and Agree with Plan of Care Patient           Patient will benefit from skilled therapeutic intervention in order to improve the following deficits and impairments:  Postural dysfunction,Pain  Visit Diagnosis: Chronic bilateral low back pain with left-sided sciatica  Retrolisthesis of vertebrae  DDD (degenerative disc disease), lumbosacral     Problem List Patient Active Problem List   Diagnosis Date Noted  . Chronic bilateral low back pain with left-sided sciatica 06/13/2020  . 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 HLD (hyperlipidemia) 06/02/2016  . Obesity 08/28/2015  . Allergic rhinitis 10/26/2014  . Contraceptive management 06/07/2014    Patty Garner 08/15/2020, 12:09 PM  [facility] [address]Phone: [phone]  Fax:  [phone] Name: Patty BUECHEMRN: [id number] of Birth: [birth year] Patty Garner PT 08/15/20 12:09 PM Phone: [phone]: [phone]

## 2020-08-22 ENCOUNTER — Other Ambulatory Visit: Payer: Self-pay

## 2020-08-22 ENCOUNTER — Ambulatory Visit: Payer: 59 | Attending: Nurse Practitioner

## 2020-08-22 DIAGNOSIS — M4310 Spondylolisthesis, site unspecified: Secondary | ICD-10-CM | POA: Insufficient documentation

## 2020-08-22 DIAGNOSIS — G8929 Other chronic pain: Secondary | ICD-10-CM | POA: Diagnosis present

## 2020-08-22 DIAGNOSIS — M5137 Other intervertebral disc degeneration, lumbosacral region: Secondary | ICD-10-CM | POA: Diagnosis present

## 2020-08-22 DIAGNOSIS — M5442 Lumbago with sciatica, left side: Secondary | ICD-10-CM | POA: Diagnosis present

## 2020-08-23 NOTE — Therapy (Signed)
[place], [place], [address] Phone: [phone]   Fax:  [phone]  Physical Therapy Treatment/Re-Cert  Patient Details  Name: Patty Garner MRN: [id number] Date of Birth: 02/13/1981 Referring Provider (PT): Garner, Patty Brooke, NP   Encounter Date: 08/22/2020   PT End of Session - 08/22/20 0931    Visit Number 5    Number of Visits 8    Date for PT Re-Evaluation 09/21/20    Authorization Type [employer] OTHER    PT Start Time 0915    PT Stop Time 0958    PT Time Calculation (min) 43 min    Activity Tolerance Patient tolerated treatment well    Behavior During Therapy [facility] for tasks assessed/performed           Past Medical History:  Diagnosis Date  . Hyperglycemia 06/13/2020    History reviewed. No pertinent surgical history.  There were no vitals filed for this visit.   Subjective Assessment - 08/22/20 0920    Subjective Pt reports she is a little sore today after a hard workout.Pt reports today is the 1st day she has had apprciable pain since dry needling.    Pain Score 5     Pain Location Back    Pain Orientation Posterior;Lower              [facility] PT Assessment - 08/23/20 0001      Assessment   Medical Diagnosis Chronic bilateral low back pain with left-sided sciatica                         [facility] Adult PT Treatment/Exercise - 08/23/20 0001      Lumbar Exercises: Stretches   Other Lumbar Stretch Exercise Child's pose forward and laterally 3x each direction, 15 sec    Other Lumbar Stretch Exercise Seated trunk forward and laterl flexion 3x each direction, 15 sec      Lumbar Exercises: Standing   Forward Lunge 15 reps    Forward Lunge Limitations each LE, VC for proper technique    Other Standing Lumbar Exercises Hinged hip squats c wand for feedback 15x f/g  lifts c 15lbs, 10x2      Lumbar Exercises: Supine   Bridge 10 reps    Single Leg Bridge 10  reps   R and L     Lumbar Exercises: Quadruped   Straight Leg Raise 10 reps    Straight Leg Raises Limitations each le to neutral ext    Opposite Arm/Leg Raise 10 reps    Opposite Arm/Leg Raise Limitations each arm/leg to neutral ext    Other Quadruped Lumbar Exercises Cat abdominal contraction, back to neutral 10x, 3 sec    Other Quadruped Lumbar Exercises Hydrant 15x                  PT Education - 08/22/20 1007    Education Details Education for proper technique and intensity level with managing her low back pain during her personal workouts. Hinged lifting, hydrant, Cat c core contraction, child's pose were added to HEP.    Person(s) Educated Patient    Methods Explanation;Demonstration;Tactile cues;Verbal cues    Comprehension Verbalized understanding;Returned demonstration;Verbal cues required;Tactile cues required            PT Short Term Goals - 08/15/20 1203      PT SHORT TERM GOAL #1   Title Pt will be Ind in an initial HEP  Status Achieved      PT SHORT TERM GOAL #2   Title Pt will voice uderstanding of measures to assist in the reduction of her low back pain    Status Achieved             PT Long Term Goals - 08/22/20 1013      PT LONG TERM GOAL #1   Title Pt will be Ind in a final HEP to maintain or progress the achieved LOF    Status On-going    Target Date 09/21/20      PT LONG TERM GOAL #2   Title Pt will report improved low back pain with daily activities, recreational activites and sleep to 4/10 or less. 08/22/20: On-going-0-5/10, but primarily 4/10 or less    Baseline met for the past week , keep for consistency    Status On-going    Target Date 08/24/20                 Plan - 08/22/20 1010    Clinical Impression Statement Pt is progressing well with re: her low back pain with it being decreased to a slight to no pain level, except for today following hard workout yesterday . Pt has benefited from the ther ex for flexibility and  strengthening, TPDN, and back care education. PT today continued to emphasize core stabilization, proper posture, and appropriate difficulty level with her perrsonal worhouts as she pursues her fitness goals. Additionallly, PT was completed for lumbopelvic/LE flexibility and strengthening with hinged hip lifting initiated. Pt will continue to benefit fro PT 1w3 to optimize her functional abilites with pain reduction and management.    Personal Factors and Comorbidities Time since onset of injury/illness/exacerbation    Examination-Activity Limitations Sleep    Stability/Clinical Decision Making Stable/Uncomplicated    Clinical Decision Making Low    Rehab Potential Excellent    PT Frequency 1x / week    PT Duration 3 weeks    PT Treatment/Interventions ADLs/Self Care Home Management;Electrical Stimulation;Iontophoresis 47m/ml Dexamethasone;Moist Heat;Traction;Ultrasound;Therapeutic exercise;Therapeutic activities;Functional mobility training;Patient/family education;Manual techniques;Passive range of motion;Dry needling;Taping    PT Next Visit Plan Re-assess FOTO, Reinforce core, HEP and manual, dry needling as needed.    PT Home Exercise Plan [id number] Hinged lifting, hydrant, Cat c core contraction, child's pose were added to HEP.    Consulted and Agree with Plan of Care Patient           Patient will benefit from skilled therapeutic intervention in order to improve the following deficits and impairments:  Postural dysfunction,Pain  Visit Diagnosis: Chronic bilateral low back pain with left-sided sciatica  Retrolisthesis of vertebrae  DDD (degenerative disc disease), lumbosacral     Problem List Patient Active Problem List   Diagnosis Date Noted  . Chronic bilateral low back pain with left-sided sciatica 06/13/2020  . 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 HLD (hyperlipidemia) 06/02/2016  . Obesity 08/28/2015  . Allergic rhinitis 10/26/2014  . Contraceptive management  06/07/2014    AGar Ponto2/09/2020, 6:02 AM  [facility] [address]Phone: [phone]  Fax:  [phone] Name: Patty MEZOMRN: [id number] of Birth: 104/10/1980

## 2020-08-29 ENCOUNTER — Ambulatory Visit: Payer: 59 | Admitting: Physical Therapy

## 2020-08-29 ENCOUNTER — Encounter: Payer: Self-pay | Admitting: Physical Therapy

## 2020-08-29 ENCOUNTER — Other Ambulatory Visit: Payer: Self-pay

## 2020-08-29 DIAGNOSIS — M5442 Lumbago with sciatica, left side: Secondary | ICD-10-CM | POA: Diagnosis not present

## 2020-08-29 DIAGNOSIS — M4310 Spondylolisthesis, site unspecified: Secondary | ICD-10-CM

## 2020-08-29 DIAGNOSIS — M5137 Other intervertebral disc degeneration, lumbosacral region: Secondary | ICD-10-CM

## 2020-08-29 DIAGNOSIS — G8929 Other chronic pain: Secondary | ICD-10-CM

## 2020-08-30 ENCOUNTER — Encounter: Payer: Self-pay | Admitting: Physical Therapy

## 2020-08-30 NOTE — Therapy (Addendum)
[place], [place], [address] Phone: [phone]   Fax:  [phone]  Physical Therapy Treatment/Dsicharge   Patient Details  Name: Patty Garner MRN: [id number] Date of Birth: 1981/03/26 Referring Provider (PT): Nche, Charlene Brooke, NP   Encounter Date: 08/29/2020   PT End of Session - 08/29/20 1028    Visit Number 6    Number of Visits 8    PT Start Time 0930    PT Stop Time 8413    PT Time Calculation (min) 44 min    Activity Tolerance Patient tolerated treatment well    Behavior During Therapy [facility] for tasks assessed/performed           Past Medical History:  Diagnosis Date  . Hyperglycemia 06/13/2020    History reviewed. No pertinent surgical history.  There were no vitals filed for this visit.   Subjective Assessment - 08/29/20 0940    Subjective Patient did not do much activity over the weekend so she got a little stiff. On Sunday she had pain but was able to use her stretches and exercises to decrease her pain. her pain was mostly on the right side.    How long can you sit comfortably? 1 hour    How long can you stand comfortably? No limitation stands at work    How long can you walk comfortably? No limitation    Diagnostic tests 08/14/19: IMPRESSION:  1. No evidence of acute fracture.  2. Moderate degenerative disc disease at L5-S1 with mild  retrolisthesis, likely degenerative.    Patient Stated Goals For the pain to resolve.    Currently in Pain? No/denies   HAD PAIN YESTERDAY                            [facility] Adult PT Treatment/Exercise - 08/30/20 0001      Self-Care   Posture reviewed how to use her HEP at home. Reviewed exercises to avoid at the gym. Reviewed how to turn all of her gym exercises into coreexercises.      Lumbar Exercises: Stretches   Active Hamstring Stretch Limitations reviewed seated for HEP 3x20 sec hold    Lower Trunk Rotation Limitations x15     Piriformis Stretch Limitations 3x20 given for seated HEP    Other Lumbar Stretch Exercise reviewed vchilds pose for HEP      Lumbar Exercises: Supine   Bridge Limitations x1`5    Other Supine Lumbar Exercises supine clamshell x20 given for post needle soreness      Manual Therapy   Manual Therapy Soft tissue mobilization;Passive ROM;Manual Traction    Manual therapy comments skilled palpation of trigger points    Soft tissue mobilization R lumbar and quadratus lumborum in sidelying stretch position    Manual Traction LAD grade III and Iv ocillations            Trigger Point Dry Needling - 08/30/20 0001    Consent Given? Yes    Education Handout Provided No    Dry Needling Comments two spots in left lumbar multifidi and 2 spots in left upper gluteal using a .30x50 and a .30x75 for thegluteal    Gluteus Medius Response Twitch response elicited;Palpable increased muscle length    Lumbar multifidi Response Twitch response elicited;Palpable increased muscle length                PT Education - 08/29/20 1028  Education Details reviewed stretching for needling. Patient has been working on her HEp and working at [employer]. Reviewed how to use her HEP.    Person(s) Educated Patient    Methods Explanation;Demonstration;Tactile cues;Verbal cues    Comprehension Verbalized understanding;Returned demonstration            PT Short Term Goals - 08/15/20 1203      PT SHORT TERM GOAL #1   Title Pt will be Ind in an initial HEP    Status Achieved      PT SHORT TERM GOAL #2   Title Pt will voice uderstanding of measures to assist in the reduction of her low back pain    Status Achieved             PT Long Term Goals - 08/30/20 1211      PT LONG TERM GOAL #1   Title Pt will be Ind in a final HEP to maintain or progress the achieved LOF    Period Weeks    Status Achieved      PT LONG TERM GOAL #2   Title Pt will report improved low back pain with daily activities,  recreational activites and sleep to 4/10 or less. 08/22/20: On-going-0-5/10, but primarily 4/10 or less    Baseline minor pain at times    Period Weeks    Status Achieved                 Plan - 08/30/20 1208    Clinical Impression Statement Patient feels comfortable with her program at this time. She fels like she can take what she has learend and work with her trainer. she continues to hvae intermittent pain, but she has been able to manage. She has had no pain n the right where she was needled afew vistis ago. Shedoes have pain on the left today. Therapy needled her left side. She had improved tendenress to trigger points after needling and soft tissue mobilization. Therapy reviewed stretches to perfreom and basic exercises for her HEP. We wwill leave her case open for 2-3 more weeks in case she needs further dry needling.    Personal Factors and Comorbidities Time since onset of injury/illness/exacerbation    Examination-Activity Limitations Sleep    Stability/Clinical Decision Making Stable/Uncomplicated    Clinical Decision Making Low    Rehab Potential Excellent    PT Frequency 1x / week    PT Duration 3 weeks    PT Treatment/Interventions ADLs/Self Care Home Management;Electrical Stimulation;Iontophoresis 7m/ml Dexamethasone;Moist Heat;Traction;Ultrasound;Therapeutic exercise;Therapeutic activities;Functional mobility training;Patient/family education;Manual techniques;Passive range of motion;Dry needling;Taping    PT Next Visit Plan Re-assess FOTO, Reinforce core, HEP and manual, dry needling as needed.    PT Home Exercise Plan [id number] Hinged lifting, hydrant, Cat c core contraction, child's pose were added to HEP.    Consulted and Agree with Plan of Care Patient           Patient will benefit from skilled therapeutic intervention in order to improve the following deficits and impairments:  Postural dysfunction,Pain  Visit Diagnosis: Chronic bilateral low back pain with  left-sided sciatica  Retrolisthesis of vertebrae  DDD (degenerative disc disease), lumbosacral  PHYSICAL THERAPY DISCHARGE SUMMARY  Visits from Start of Care: 6  Current functional level related to goals / functional outcomes: Significant improvement in pain   Remaining deficits: Mild pain at times   Education / Equipment:  HEP  Plan: Patient agrees to discharge.  Patient goals were met. Patient is being discharged  due to being pleased with the current functional level.  ?????       Problem List Patient Active Problem List   Diagnosis Date Noted  . Chronic bilateral low back pain with left-sided sciatica 06/13/2020  . 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 HLD (hyperlipidemia) 06/02/2016  . Obesity 08/28/2015  . Allergic rhinitis 10/26/2014  . Contraceptive management 06/07/2014    Carney Living PT DPT 08/30/2020, 12:14 PM  [facility] [address] [place], [place], [address] Phone: [phone]   Fax:  [phone]  Name: Patty Garner MRN: [id number] Date of Birth: [birth year]

## 2020-12-19 HISTORY — PX: MOUTH SURGERY: SHX715

## 2021-01-03 ENCOUNTER — Emergency Department (HOSPITAL_COMMUNITY): Payer: 59

## 2021-01-03 ENCOUNTER — Encounter: Payer: Self-pay | Admitting: Nurse Practitioner

## 2021-01-03 ENCOUNTER — Emergency Department (HOSPITAL_COMMUNITY)
Admission: EM | Admit: 2021-01-03 | Discharge: 2021-01-03 | Disposition: A | Discharge status: Home / Self Care | Payer: 59 | Attending: Emergency Medicine | Admitting: Emergency Medicine

## 2021-01-03 ENCOUNTER — Other Ambulatory Visit: Payer: Self-pay

## 2021-01-03 ENCOUNTER — Encounter (HOSPITAL_COMMUNITY): Payer: Self-pay | Admitting: Emergency Medicine

## 2021-01-03 ENCOUNTER — Other Ambulatory Visit (HOSPITAL_COMMUNITY): Payer: Self-pay

## 2021-01-03 DIAGNOSIS — N946 Dysmenorrhea, unspecified: Secondary | ICD-10-CM | POA: Insufficient documentation

## 2021-01-03 DIAGNOSIS — R55 Syncope and collapse: Secondary | ICD-10-CM | POA: Insufficient documentation

## 2021-01-03 DIAGNOSIS — W19XXXA Unspecified fall, initial encounter: Secondary | ICD-10-CM | POA: Insufficient documentation

## 2021-01-03 DIAGNOSIS — S0181XA Laceration without foreign body of other part of head, initial encounter: Secondary | ICD-10-CM | POA: Insufficient documentation

## 2021-01-03 DIAGNOSIS — Z87891 Personal history of nicotine dependence: Secondary | ICD-10-CM | POA: Insufficient documentation

## 2021-01-03 DIAGNOSIS — S0993XA Unspecified injury of face, initial encounter: Secondary | ICD-10-CM | POA: Diagnosis present

## 2021-01-03 LAB — URINALYSIS, ROUTINE W REFLEX MICROSCOPIC
Bilirubin Urine: NEGATIVE
Glucose, UA: 150 mg/dL — AB
Ketones, ur: 80 mg/dL — AB
Leukocytes,Ua: NEGATIVE
Nitrite: NEGATIVE
Protein, ur: 100 mg/dL — AB
RBC / HPF: >50 RBC/hpf — ABNORMAL HIGH (ref 0–5)
Specific Gravity, Urine: 1.027 (ref 1.005–1.030)
pH: 5 (ref 5.0–8.0)

## 2021-01-03 LAB — CBC
HCT: 43.6 % (ref 36.0–46.0)
Hemoglobin: 14 g/dL (ref 12.0–15.0)
MCH: 29.5 pg (ref 26.0–34.0)
MCHC: 32.1 g/dL (ref 30.0–36.0)
MCV: 92 fL (ref 80.0–100.0)
Platelets: 266 10*3/uL (ref 150–400)
RBC: 4.74 MIL/uL (ref 3.87–5.11)
RDW: 12.6 % (ref 11.5–15.5)
WBC: 8.2 10*3/uL (ref 4.0–10.5)
nRBC: 0 % (ref 0.0–0.2)

## 2021-01-03 LAB — BASIC METABOLIC PANEL
Anion gap: 8 (ref 5–15)
BUN: 9 mg/dL (ref 6–20)
CO2: 23 mmol/L (ref 22–32)
Calcium: 9.1 mg/dL (ref 8.9–10.3)
Chloride: 106 mmol/L (ref 98–111)
Creatinine, Ser: 0.74 mg/dL (ref 0.44–1.00)
GFR, Estimated: >60 mL/min (ref >60)
Glucose, Bld: 145 mg/dL — ABNORMAL HIGH (ref 70–99)
Potassium: 3.6 mmol/L (ref 3.5–5.1)
Sodium: 137 mmol/L (ref 135–145)

## 2021-01-03 LAB — I-STAT BETA HCG BLOOD, ED (MC, WL, AP ONLY): I-stat hCG, quantitative: <5 m[IU]/mL (ref <5)

## 2021-01-03 MED ORDER — RISPERIDONE 1 MG PO TABS
1.0000 mg | ORAL_TABLET | Freq: Every day | ORAL | 0 refills | Status: DC
  Filled 2021-01-03: qty 30, 30d supply, fill #0

## 2021-01-03 MED ORDER — KETOROLAC TROMETHAMINE 15 MG/ML IJ SOLN
15.0000 mg | Freq: Once | INTRAMUSCULAR | Status: AC
  Administered 2021-01-03: 15 mg via INTRAMUSCULAR
  Filled 2021-01-03: qty 1

## 2021-01-03 MED ORDER — LIDOCAINE-EPINEPHRINE-TETRACAINE (LET) TOPICAL GEL
3.0000 mL | Freq: Once | TOPICAL | Status: DC
  Filled 2021-01-03: qty 3

## 2021-01-03 NOTE — ED Notes (Signed)
Patient transported to CT 

## 2021-01-03 NOTE — ED Provider Notes (Signed)
  Physical Exam  BP 131/76 (BP Location: Right Arm)   Pulse 64   Temp 98.6 F (37 C) (Oral)   Resp 16   LMP 01/02/2021   SpO2 100%   Physical Exam  ED Course/Procedures   Clinical Course as of 01/03/21 1638  Thu Jan 03, 2021  1420 LET applied.  [HS]    Clinical Course User Index [HS] Sherrill Raring, PA-C    Procedures  MDM    Care of this patient assumed from preceding ED provider Sherrill Raring, PA-C at time of shift change.  Please see her associated note for further insight into the patient's ED course.  In brief, patient is Garner 40 year old female with history of severe menstrual cramping who presented to the emergency department after syncopal episode this morning with large laceration to the lower lip as well as fractured teeth.  Patient has undergone suture repair of her facial laceration, and EKG, laboratory studies, and urinalysis were very reassuring.  At time of shift change, patient is awaiting results from Garner CT maxillofacial study.  If normal, patient may be discharged home with close outpatient follow-up with both OB/GYN and dentistry, with whom she is already established as Garner patient.  If CT reveals underlying fracture, will proceed with appropriate consultation and disposition planning at that time.  CT revealed fracture of the anterior alveolar process of the mandible extending through the root of the central incisor on the right lateral incisor with loosening of the right lateral incisor tooth.  Consult has been placed to Dr. Conley Simmonds, who is on-call for mandible.  Case discussed with Dr. Conley Simmonds who suggests that the patient should follow-up in his office tomorrow afternoon at the end of the day for relocation and splinting of the dislocated tooth.  Will provide contact information for [facility] oral and dental implant surgery in [place] for this patient.  No further work-up warranted in the ER at this time.  [name] voiced understanding of her medical evaluation and  treatment plan.  Each of her questions was answered to her expressed satisfaction.  Strict return precautions are given.  Patient is stable and appropriate for discharge at this time.  This chart was dictated using voice recognition software, Dragon. Despite the best efforts of this provider to proofread and correct errors, errors may still occur which can change documentation meaning.      Patty Garner 01/03/21 1640    Patty Shanks, MD 01/07/21 [phone]

## 2021-01-03 NOTE — Discharge Instructions (Addendum)
You were seen today in the emergency department for syncope and facial trauma.   The sutures need to be in place for the next 5 days.  On the fifth day you can have those removed by your primary care provider, or return back to an urgent care or the emergency department.  If you develop fevers, chills, if the wound appears to become to be becoming infected please return back to the emergency department for further evaluation.  To help it heal, please stay out of the sun.  You are in the sun make sure you apply sunscreen to the area.  Clean it with soap and water, please avoid hydrogen peroxide at all cost.  Your CT scan did reveal a fracture in your lower jaw on the right side.  For this reason your case was discussed with oral surgeon, Dr. Conley Simmonds.  He recommends that you see him in his office tomorrow afternoon to have the tooth replaced and splinted.  Please call his office first thing this afternoon or tomorrow morning to schedule follow-up for tomorrow afternoon.  According to Dr. Conley Simmonds, he should be added to the schedule at the end of the day.  His office is called [facility] and the phone number is: [phone].  His office is located in [place], so please schedule enough time for the travel to his location.  You may alternate Tylenol and ibuprofen every 3 hours to manage your pain.  Although we were unable to determine a cause for the syncopal episode earlier today, we were able to rule out considerable emergent disease.  I would like you to follow-up with your OBGYN to evaluate your difficult menstrual cycles. Continue taking motrin and tylenol as needed for the pain.  If you experience another syncopal episode, please return back to the emergency department.

## 2021-01-03 NOTE — ED Triage Notes (Signed)
Pt states she had a syncopal episode this morning at 6am. She states she has had a very bad menstrual cycle and was feeling dizzy. Pt woke up to seeing blood all over the wall, she bit through her lower lip and her teeth are pushed in.

## 2021-01-03 NOTE — ED Provider Notes (Signed)
[facility] EMERGENCY DEPARTMENT Provider Note   CSN: [id number] Arrival date & time: 01/03/21  0802     History Chief Complaint  Patient presents with   Loss of Consciousness    Patty Garner is a 40 y.o. female.  HPI  Patient presents with laceration under leg due to fall this morning.  States she.  Cramps were so terrible that she had a syncopal episode this morning around 6 AM.  She also endorses a headache.  Noted that bottom middle tooth was pushed from normal position during the fall.  She is worried that the tooth lacerated the lower lip.  States that she has been having terrible menstrual Cramps since November when she discontinued birth control.  Complains of menorrhagia as well, but her cycles are always regular.  She takes Advil, but without relief.  Has never had a syncopal episode like this before.  Denies history of previous abdominal surgeries including hysterectomy.  No history of uterine fibroids. She is not complaining of chest pain, shortness of breath.  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 No pertinent surgical history.   OB History   No obstetric history on file.     Family History  Problem Relation Age of Onset   Diabetes Maternal Grandmother    Heart disease Maternal Grandmother    Hypertension Maternal Grandmother    Breast cancer Neg Hx     Social History   Tobacco Use   Smoking status: Former    Pack years: 0.00   Smokeless tobacco: Never  Vaping Use   Vaping Use: Never used  Substance Use Topics   Alcohol use: Yes    Alcohol/week: 8.0 standard drinks    Types: 4 Cans of  beer, 4 Shots of liquor per week    Comment: moderate   Drug use: No    Home Medications Prior to Admission medications   Medication Sig Start Date End Date Taking? Authorizing Provider  cetirizine (ZYRTEC) 10 MG tablet Take 1 tablet (10 mg total) by mouth daily. 10/26/14   Lucille Passy, MD  Multiple Vitamin (MULTIVITAMIN) tablet Take 1 tablet by mouth daily.    [provider]    Allergies    Statins and Tylenol with codeine #3 [acetaminophen-codeine]  Review of Systems   Review of Systems  Constitutional:  Negative for chills and fever.  HENT:  Negative for ear pain and sore throat.   Eyes:  Negative for pain and visual disturbance.  Respiratory:  Negative for cough and shortness of breath.   Cardiovascular:  Negative for chest pain and palpitations.  Gastrointestinal:  Negative for abdominal pain, nausea and vomiting.  Genitourinary:  Positive for menstrual problem, pelvic pain and vaginal bleeding. Negative for dysuria and hematuria.  Musculoskeletal:  Negative for arthralgias and back pain.  Skin:  Negative for color change and rash.  Neurological:  Positive for headaches. Negative for seizures and syncope.  All other systems reviewed and are negative.  Physical Exam Updated Vital Signs BP (!) 106/56 (BP Location: Right Arm)   Pulse 64   Temp 98.6 F (37 C) (Oral)   Resp 20  LMP 01/02/2021   SpO2 100%   Physical Exam Vitals and nursing note reviewed. Exam conducted with a chaperone present.  Constitutional:      General: She is not in acute distress.    Appearance: Normal appearance. She is well-developed.  HENT:     Head: Normocephalic and atraumatic.     Comments: Facial trauma. See photos.  Eyes:     General: No scleral icterus.    Extraocular Movements: Extraocular movements intact.     Conjunctiva/sclera: Conjunctivae normal.     Pupils: Pupils are equal, round, and reactive to light.  Cardiovascular:     Rate and Rhythm: Normal rate and regular  rhythm.     Heart sounds: No murmur heard. Pulmonary:     Effort: Pulmonary effort is normal. No respiratory distress.     Breath sounds: Normal breath sounds.  Abdominal:     Palpations: Abdomen is soft.     Tenderness: There is no abdominal tenderness.  Musculoskeletal:     Cervical back: Neck supple.  Skin:    General: Skin is warm and dry.     Coloration: Skin is not jaundiced.  Neurological:     General: No focal deficit present.     Mental Status: She is alert. Mental status is at baseline.     Cranial Nerves: No cranial nerve deficit.     Coordination: Coordination normal.          ED Results / Procedures / Treatments   Labs (all labs ordered are listed, but only abnormal results are displayed) Labs Reviewed  BASIC METABOLIC PANEL - Abnormal; Notable for the following components:      Result Value   Glucose, Bld 145 (*)    All other components within normal limits  URINALYSIS, ROUTINE W REFLEX MICROSCOPIC - Abnormal; Notable for the following components:   APPearance HAZY (*)    Glucose, UA 150 (*)    Hgb urine dipstick LARGE (*)    Ketones, ur 80 (*)    Protein, ur 100 (*)    RBC / HPF >50 (*)    Bacteria, UA RARE (*)    All other components within normal limits  CBC  I-STAT BETA HCG BLOOD, ED ([facility], [facility], [facility] ONLY)  CBG MONITORING, ED    EKG None  Radiology No results found.  Procedures .Marland KitchenLaceration Repair  Date/Time: 01/03/2021 3:12 PM Performed by: Sherrill Raring, PA-C Authorized by: Sherrill Raring, PA-C   Consent:    Consent obtained:  Verbal   Consent given by:  Patient   Risks discussed:  Infection, need for additional repair, pain, poor cosmetic result and poor wound healing   Alternatives discussed:  No treatment and delayed treatment Universal protocol:    Procedure explained and questions answered to patient or proxy's satisfaction: yes     Relevant documents present and verified: yes     Test results available: yes     Imaging studies  available: yes     Required blood products, implants, devices, and special equipment available: yes     Site/side marked: yes     Immediately prior to procedure, a time out was called: yes     Patient identity confirmed:  Verbally with patient Anesthesia:    Anesthesia method:  Topical application   Topical anesthetic:  LET Laceration details:    Location:  Face   Face location:  Chin   Length (cm):  2   Depth (mm):  5 Treatment:    Area cleansed  with:  Povidone-iodine   Amount of cleaning:  Standard Skin repair:    Repair method:  Sutures   Suture size:  6-0   Suture material:  Prolene   Suture technique:  Simple interrupted   Number of sutures:  6 Approximation:    Approximation:  Close Repair type:    Repair type:  Simple   Medications Ordered in ED Medications - No data to display  ED Course  I have reviewed the triage vital signs and the nursing notes.  Pertinent labs & imaging results that were available during my care of the patient were reviewed by me and considered in my medical decision making (see chart for details).  Clinical Course as of 01/03/21 1511  Thu Jan 03, 2021  1420 LET applied.  [HS]    Clinical Course User Index [HS] Sherrill Raring, PA-C   MDM Rules/Calculators/A&P                          Patient is a 40 year old female presenting for syncopal episode and facial trauma.  I have ordered a CT max to evaluate for trauma to the head.  There is some concern that the laceration goes to the dental bridge.  The syncopal episode does appear to be due to pain based on this work-up.  It could also be due to orthostatic hypotension, although that was not seen on exam.  Dehydration also may have contributed.  She is not anemic, no ectopic pregnancy, no UTI, no elevated white blood cell count concerning for infection, not hypoglycemic.  EKG is without arrhythmia.  I advised her to follow-up with her OBG for further work-up of the menorrhagia.  She is already  reached out to make an appointment.  I considered the possibility of PE or ACS, but given her age, lack of risk factors, no chest pain or shortness of breath I do not think that is the ultimate cause.   She will require dental follow-up.  Topical let applied to the chin laceration.  I will repair the laceration to her chin, and she will need to have the sutures removed in 5 days.  She voiced understanding with this plan.  Laceration repaired.  Patient tolerated well.  Discussed HPI, physical exam and plan of care for this patient with attending Carmin Muskrat. The attending physician evaluated this patient as part of a shared visit and agrees with plan of care.   Final Clinical Impression(s) / ED Diagnoses Final diagnoses:  None    Rx / DC Orders ED Discharge Orders     None        Sherrill Raring, Hershal Coria 01/03/21 1533    Carmin Muskrat, MD 01/03/21 1620

## 2021-01-08 ENCOUNTER — Other Ambulatory Visit: Payer: Self-pay

## 2021-01-09 ENCOUNTER — Ambulatory Visit: Payer: 59 | Admitting: Nurse Practitioner

## 2021-01-09 ENCOUNTER — Ambulatory Visit: Payer: 59 | Admitting: Family Medicine

## 2021-01-09 ENCOUNTER — Encounter: Payer: Self-pay | Admitting: Family Medicine

## 2021-01-09 VITALS — BP 126/80 | HR 77 | Temp 97.8°F | Ht 62.5 in | Wt 159.4 lb

## 2021-01-09 DIAGNOSIS — S0180XD Unspecified open wound of other part of head, subsequent encounter: Secondary | ICD-10-CM | POA: Diagnosis not present

## 2021-01-09 DIAGNOSIS — S01502D Unspecified open wound of oral cavity, subsequent encounter: Secondary | ICD-10-CM

## 2021-01-09 DIAGNOSIS — N946 Dysmenorrhea, unspecified: Secondary | ICD-10-CM

## 2021-01-09 DIAGNOSIS — S02670D Fracture of alveolus of mandible, unspecified side, subsequent encounter for fracture with routine healing: Secondary | ICD-10-CM | POA: Diagnosis not present

## 2021-01-09 MED ORDER — TRAMADOL HCL 50 MG PO TABS: 50.0000 mg | ORAL_TABLET | Freq: Three times a day (TID) | ORAL | 0 refills | Status: DC | PRN

## 2021-01-09 NOTE — Progress Notes (Signed)
[facility] [address] [place] [address] Dept: [phone] Dept Fax: [phone]  Office Visit  Subjective:    Patient ID: Patty Garner, female    DOB: [birth year], 40 y.o..   MRN: [id number]  Chief Complaint  Patient presents with   Acute Visit    F/u to remove stiches from lip after a fall on 01/03/21 hitting a wall. C/o still having a hole in the inside of her lip.      History of Present Illness:  Patient is in today for suture removal from a laceration to her chin. She was seen at [facility] on 01/03/2021, having had a syncopal spell at home. She believes she struck a window sill with her chin when she passed out. She had a laceration ot the chin, a laceration to the buccal mucosa inside the lip and was found to have an alveolar process fracture tot he mandible. [name] has been seen by Dr. Conley Simmonds (maxillofacial surgeon) and had a bracing appliance placed, which she will use for 5 weeks. she notes some considerable pain, esp. related to the metal brace rubbing at the laceration inside her lip.  [name] notes a history of dysmenorrhea for many years. She was previously placed on birth control pills, but this did not resolve her issues. She was recently referred to GYN by her PCP, Ms. Nche.  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  Family History  Problem Relation Age of Onset   Diabetes Maternal Grandmother    Heart disease Maternal Grandmother    Hypertension Maternal Grandmother    Breast cancer Neg Hx     Outpatient Medications Prior to Visit  Medication Sig Dispense Refill   cetirizine (ZYRTEC)  10 MG tablet Take 1 tablet (10 mg total) by mouth daily. 30 tablet 11   chlorhexidine (PERIDEX) 0.12 % solution SMARTSIG:By Mouth     ibuprofen (ADVIL) 600 MG tablet Take 600 mg by mouth every 6 (six) hours as needed.     Multiple Vitamin (MULTIVITAMIN) tablet Take 1 tablet by mouth daily.     No facility-administered medications prior to visit.   Allergies  Allergen Reactions   Statins     myalgias   Tylenol With Codeine #3 [Acetaminophen-Codeine] Nausea And Vomiting   Objective:   Today's Vitals   01/09/21 1021  BP: 126/80  Pulse: 77  Temp: 97.8 F (36.6 C)  TempSrc: Temporal  SpO2: 99%  Weight: 159 lb 6.4 oz (72.3 kg)  Height: 5' 2.5" (1.588 m)   Body mass index is 28.69 kg/m.   General: Well developed, well nourished. No acute distress. HEENT: There is a healing laceration about 3 cm long horizontally over the chin. I removed 6   stitches and placed a sterile bandage. There is an irregular laceration to the buccal mucosa of   the inner lip. This looks clean and is healing by secondary intention. There is a metal device   bridging across the lower incisors and cuspids/bicuspids. Psych: Alert and oriented x3. Normal mood and affect.  Health Maintenance Due  Topic Date Due   COVID-19 Vaccine (1) Never done   Imaging: CT Maxillofacial (01/03/2021): IMPRESSION: Fracture  of the anterior alveolar process of the mandible with loosening of the right lateral incisor tooth. Correlation with clinical exam recommended.  Lab Results Lab Results  Component Value Date   WBC 8.2 01/03/2021   HGB 14.0 01/03/2021   HCT 43.6 01/03/2021   MCV 92.0 01/03/2021   PLT 266 01/03/2021     Assessment & Plan:   1. Open wound of chin, subsequent encounter Reviewed ER notes, labs, and CT results. Removed stitches today. Discussed routine wound management with Ms. Mel Almond.  2. Open wound of mouth, subsequent encounter Wound is healing by secondary intention. This is being irritated by  mandibular appliance.  3. Closed fracture of alveolar process of mandible with routine healing, unspecified laterality, subsequent encounter Under care of Dr. Conley Simmonds. Plan to use mandibular stabilizing appliance for 5 weeks total. I will provide some tramadol to provide pain relief, esp. as mouth laceration is healing.  - traMADol (ULTRAM) 50 MG tablet; Take 1 tablet (50 mg total) by mouth every 8 (eight) hours as needed for up to 5 days.  Dispense: 15 tablet; Refill: 0  4. Dysmenorrhea Has been referred to GYN. No sign of anemia at present.   [name], MD

## 2021-01-09 NOTE — Patient Instructions (Signed)
Recommend warm, salt water rinses for inner lip wound.

## 2021-01-14 ENCOUNTER — Other Ambulatory Visit: Payer: Self-pay

## 2021-01-14 ENCOUNTER — Encounter: Payer: Self-pay | Admitting: Obstetrics and Gynecology

## 2021-01-14 ENCOUNTER — Ambulatory Visit: Payer: 59 | Admitting: Obstetrics and Gynecology

## 2021-01-14 VITALS — BP 108/62 | HR 96 | Ht 62.5 in | Wt 159.0 lb

## 2021-01-14 DIAGNOSIS — N946 Dysmenorrhea, unspecified: Secondary | ICD-10-CM | POA: Diagnosis not present

## 2021-01-14 NOTE — Progress Notes (Signed)
GYNECOLOGY  VISIT   HPI: 40 y.o.   Married  [place] American  female   [place] with Patient's last menstrual period was 01/02/2021.   here for dysmenorrhea.  Has always had terrible periods per patient, and she wants to know why.  Used birth control in the past.  Does not need pregnancy prevention.  She has a wife.   Recently woke up with bad cramping.  Woke up bleeding from her mouth as she fainted and fell.  She went to the ER and she saw an oral surgeon for surgery.  She wants to understand her pain better.  No prior pelvic US.  Taking Ibuprofen 800 mg every 4 - 5 hours for the first 2 days.   Menses occur every month and last 4 days and are heavy for 2 days.   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   She declines future childbearing.  She declines a Mirena IUD and medical therapy.   Works as a Patent examiner.   GYNECOLOGIC HISTORY: Patient's last menstrual period was 01/02/2021. 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 30 tablet 11   chlorhexidine (PERIDEX) 0.12 % solution SMARTSIG:By Mouth     Multiple Vitamin (MULTIVITAMIN) tablet Take 1 tablet by mouth daily.     No current facility-administered medications for this visit.     ALLERGIES: Statins and Tylenol with codeine #3 [acetaminophen-codeine]  Family History  Problem Relation Age of Onset   Diabetes Maternal Grandmother    Heart disease Maternal Grandmother    Hypertension Maternal Grandmother    Breast cancer Neg Hx     Social History   Socioeconomic History   Marital status: Married    Spouse name: Not on file   Number of children: Not on file   Years of education: Not on file   Highest education level: Not on file  Occupational History   Not on file  Tobacco Use   Smoking status: Former    Pack years: 0.00   Smokeless tobacco: Never  Vaping Use   Vaping Use: Never used  Substance and Sexual Activity   Alcohol use:  Not Currently   Drug use: No   Sexual activity: Yes    Birth control/protection: Other-see comments    Comment: Female partner  Other Topics Concern   Not on file  Social History Narrative   Not on file   Social Determinants of Health   Financial Resource Strain: Not on file  Food Insecurity: Not on file  Transportation Needs: Not on file  Physical Activity: Not on file  Stress: Not on file  Social Connections: Not on file  Intimate Partner Violence: Not on file    Review of Systems  Genitourinary:        Dysmenorrhea  All other systems reviewed and are negative.  PHYSICAL EXAMINATION:    BP 108/62   Pulse 96   Ht 5' 2.5" (1.588 m)   Wt 159 lb (72.1 kg)   LMP 01/02/2021   SpO2 98%   BMI 28.62 kg/m     General appearance: alert, cooperative and appears stated age Head: Normocephalic, without obvious abnormality, atraumatic Neck: no adenopathy, supple, symmetrical, trachea midline and thyroid normal to inspection  and palpation Lungs: clear to auscultation bilaterally Heart: regular rate and rhythm Abdomen: soft, non-tender, no masses,  no organomegaly Extremities: extremities normal, atraumatic, no cyanosis or edema Skin: Skin color, texture, turgor normal. No rashes or lesions Lymph nodes: Cervical, supraclavicular nodes are normal. No abnormal inguinal nodes palpated Neurologic: Grossly normal  Pelvic: External genitalia:  no lesions              Urethra:  normal appearing urethra with no masses, tenderness or lesions              Bartholins and Skenes: normal                 Vagina: normal appearing vagina with normal color and discharge, no lesions              Cervix: no lesions                Bimanual Exam:  Uterus:  normal size, contour, position, consistency, mobility, non-tender              Adnexa: no mass, fullness, tenderness              Rectal exam: yes.  Confirms.              Anus:  normal sphincter tone, no lesions  Chaperone was present for exam.  ASSESSMENT  Dysmenorrhea.  Remote history of chlamydia.  PLAN  We discussed dysmenorrhea and potential etiologies - endometriosis, fibrosis, ovarian pathology. Return for pelvic US.  Treatment options reviewed - NSAIDS, Mirena, continuous COCs, Depo Provera, Elagolix, and laparoscopic hysterectomy.  She is interested in a permanent solution at this time.  I recommend reducing her Ibuprofen to 800 mg every 8 hours and take with food.

## 2021-01-14 NOTE — Patient Instructions (Signed)
Dysmenorrhea °Dysmenorrhea refers to cramps caused by the muscles of the uterus tightening (contracting) during a menstrual period. Dysmenorrhea may be mild, or it may be severe enough to interfere with everyday activities for a few days each month. Primary dysmenorrhea is menstrual cramps that last a couple of days when a female starts having menstrual periods or soon after. As a female gets older or has a baby, the cramps will usually lessen or disappear. °Secondary dysmenorrhea begins later in life and is caused by a disorder in the reproductive system. It lasts longer, and it may cause more pain than primary dysmenorrhea. The pain may start before the period and last a few days after the period. °What are the causes? °Dysmenorrhea is usually caused by an underlying problem, such as: °Endometriosis. The tissue that lines the uterus (endometrium) growing outside of the uterus in other areas of the body. °Adenomyosis. Endometrial tissue growing into the muscular walls of the uterus. °Pelvic congestive syndrome. Blood vessels in the pelvis that fill with blood just before the menstrual period. °Overgrowth of cells (polyps) in the endometrium or the lower part of the uterus (cervix). °Uterine prolapse. The uterus dropping down into the vagina due to stretched or weak muscles. °Bladder problems, such as infection or inflammation. °Intestinal problems, such as a tumor or irritable bowel syndrome. °Cancer of the reproductive organs or bladder. °Other causes of this condition may result from: °A severely tipped uterus. °A cervix that is closed or has a small opening. °Noncancerous (benign) tumors in the uterus (fibroids). °Pelvic inflammatory disease (PID). °Pelvic scarring (adhesions) from a previous surgery. °An ovarian cyst. °An IUD (intrauterine device). °What increases the risk? °You are more likely to develop this condition if: °You are younger than 42 years old. °You started puberty early. °You have irregular or  heavy bleeding. °You have never given birth. °You have a family history of dysmenorrhea. °You smoke or use nicotine products. °You have high body weight or a low body weight. °What are the signs or symptoms? °Symptoms of this condition include: °Cramping, throbbing pain in lower abdomen or lower back, or a feeling of fullness in the lower abdomen. °Periods lasting for longer than 7 days. °Headaches. °Bloating. °Fatigue. °Nausea or vomiting. °Diarrhea or loose stools. °Sweating or dizziness. °How is this diagnosed? °This condition may be diagnosed based on: °Your symptoms. °Your medical history. °A physical exam. °Blood tests. °A Pap test. This is a test in which cells from the cervix are tested for signs of cancer or infection. °A pregnancy test. °You may also have other tests, including: °Imaging tests, such as: °Ultrasound. °A procedure to remove and examine a sample of endometrial tissue (dilation and curettage, D&C). °A procedure to visually examine the inside of: °The uterus (hysteroscopy). °The abdomen or pelvis (laparoscopy). °The bladder (cystoscopy). °X-rays. °CT scan. °MRI. °How is this treated? °Treatment depends on the cause of the dysmenorrhea. Treatment may include medicines, such as: °Pain medicines. °Hormone replacement therapy. °Injections of progesterone to stop the menstrual period. °Birth control pills that contain the hormone progesterone. °An IUD that contains the hormone progesterone. °NSAIDs, such as ibuprofen. These may help to stop the production of hormones that cause cramps. °Antidepressant medicines. °Other treatment may include: °Surgery to remove adhesions, endometriosis, ovarian cysts, fibroids, or the entire uterus (hysterectomy). °Endometrial ablation. This is a procedure to destroy the endometrium. °Presacral neurectomy. This is a procedure to cut the nerves in the bottom of the spine (sacrum) that go to the reproductive organs. °Sacral nerve   stimulation. This is a procedure to  apply an electric current to nerves in the sacrum. °Exercise and physical therapy. °Meditation, yoga, and acupuncture. °Work with your health care provider to determine what treatment or combination of treatments is best for you. °Follow these instructions at home: °Relieving pain and cramping ° °If directed, apply heat to your lower back or abdomen when you experience pain or cramps. Use the heat source that your health care provider recommends, such as a moist heat pack or a heating pad. °Place a towel between your skin and the heat source. °Leave the heat on for 20-30 minutes. °Remove the heat if your skin turns bright red. This is especially important if you are unable to feel pain, heat, or cold. You may have a greater risk of getting burned. °Do not sleep with a heating pad on. °Exercise. Activities such as walking, swimming, or biking can help to relieve cramps. °Massage your lower back or abdomen to help relieve pain. °General instructions °Take over-the-counter and prescription medicines only as told by your health care provider. °Ask your health care provider if the medicine prescribed to you requires you to avoid driving or using machinery. °Avoid alcohol and caffeine during and right before your period. These can make cramps worse. °Do not use any products that contain nicotine or tobacco. These products include cigarettes, chewing tobacco, and vaping devices, such as e-cigarettes. If you need help quitting, ask your health care provider. °Keep all follow-up visits. This is important. °Contact a health care provider if: °You have pain that gets worse or does not get better with medicine. °You have pain with sex. °You develop nausea or vomiting with your period that is not controlled with medicine. °Get help right away if: °You faint. °Summary °Dysmenorrhea refers to cramps caused by the muscles of the uterus tightening (contracting) during a menstrual period. °Dysmenorrhea may be mild, or it may be  severe enough to interfere with everyday activities for a few days each month. °Treatment depends on the cause of the dysmenorrhea. °Work with your health care provider to determine what treatment or combination of treatments is best for you. °This information is not intended to replace advice given to you by your health care provider. Make sure you discuss any questions you have with your health care provider. °Document Revised: 02/22/2020 Document Reviewed: 02/22/2020 °Elsevier Patient Education © 2022 [employer] ° °

## 2021-03-01 ENCOUNTER — Other Ambulatory Visit: Payer: Self-pay | Admitting: Nurse Practitioner

## 2021-03-01 DIAGNOSIS — Z1231 Encounter for screening mammogram for malignant neoplasm of breast: Secondary | ICD-10-CM

## 2021-03-05 NOTE — Progress Notes (Signed)
GYNECOLOGY  VISIT   HPI: 40 y.o.   Married  [place] American  female   Patty Garner with Patient's last menstrual period was 02/28/2021.   here for pelvic ultrasound for dysmenorrhea.   States crazy pain and heavy bleeding with her last cycle.   Desires hysterectomy and declines future childbearing. She declines medical therapy such as Mirena.  Glucose 145 on 01/03/21 at hospital with ER visit.   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   GYNECOLOGIC HISTORY: Patient's last menstrual period was 02/28/2021. 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 30 tablet 11   chlorhexidine (PERIDEX) 0.12 % solution SMARTSIG:By Mouth     Multiple Vitamin (MULTIVITAMIN) tablet Take 1 tablet by mouth daily.     No current facility-administered medications for this visit.     ALLERGIES: Statins and Tylenol with codeine #3 [acetaminophen-codeine]  Family History  Problem Relation  Age of Onset   Diabetes Maternal Grandmother    Heart disease Maternal Grandmother    Hypertension Maternal Grandmother    Breast cancer Neg Hx     Social History   Socioeconomic History   Marital status: Married    Spouse name: Not on file   Number of children: Not on file   Years of education: Not on file   Highest education level: Not on file  Occupational History   Not on file  Tobacco Use   Smoking status: Former   Smokeless tobacco: Never  Vaping Use   Vaping Use: Never used  Substance and Sexual Activity   Alcohol use: Not Currently   Drug use: No   Sexual activity: Yes    Birth control/protection: Other-see comments    Comment: Female partner  Other Topics Concern   Not on file  Social History Narrative   Not on file   Social Determinants of Health   Financial Resource Strain: Not on file  Food Insecurity: Not on file  Transportation Needs: Not on file  Physical Activity: Not on file  Stress: Not on file  Social Connections: Not on file  Intimate Partner Violence: Not on file    Review of Systems  All  other systems reviewed and are negative.  PHYSICAL EXAMINATION:    BP 100/70   Pulse 62   Ht 5' 2.5" (1.588 m)   Wt 159 lb (72.1 kg)   LMP 02/28/2021   SpO2 100%   BMI 28.62 kg/m     General appearance: alert, cooperative and appears stated age  Pelvic US: Uterus 8.92 x 7.69 x 4.5 cm.  8 fibroids:  3.16 cm, 2.66 cm. 1.02 cm, 1.14 cm. 1.21 cm, 1.35 cm, 2.42 cm, and 2.31 cm. Intramural and subserous.  EMS 4.18 mm. No masses.  Cavity not distorted by fibroids.  Ovaries normal with sparse follicles bilaterally.  No adnexal masses.  No free fluid.  ASSESSMENT  Dysmenorrhea.  Fibroids.  Elevated glucose.  PLAN  Pelvic ultrasound images and report reviewed with patient.  We discussed fibroids and dysmenorrhea. Endometriosis may be present but is not always detected with pelvic ultrasound. It is primarily a surgical/biopsy diagnosis.  I  discussed total laparoscopic hysterectomy with bilateral salpingectomy and possible bilateral oophorectomy (if abnormal), cystoscopy.    I reviewed risks, benefits, and alternatives.  Risks include but are not limited to bleeding, infection, damage to surrounding organs, pneumonia, reaction to anesthesia, DVT, PE, death, need for reoperation, hernia formation, vaginal cuff dehiscence, neuropathy, continued pain, need to convert to a traditional laparotomy incision to complete the procedure.   Surgical expectations and recovery discussed.  Patient wishes to proceed.   An After Visit Summary was printed and given to the patient.  25 min total time was spent for this patient encounter, including preparation, face-to-face counseling with the patient, coordination of care, and documentation of the encounter.

## 2021-03-07 ENCOUNTER — Telehealth: Payer: Self-pay | Admitting: Obstetrics and Gynecology

## 2021-03-07 ENCOUNTER — Ambulatory Visit (INDEPENDENT_AMBULATORY_CARE_PROVIDER_SITE_OTHER): Payer: 59 | Admitting: Obstetrics and Gynecology

## 2021-03-07 ENCOUNTER — Encounter: Payer: Self-pay | Admitting: Obstetrics and Gynecology

## 2021-03-07 ENCOUNTER — Ambulatory Visit (INDEPENDENT_AMBULATORY_CARE_PROVIDER_SITE_OTHER): Payer: 59

## 2021-03-07 ENCOUNTER — Other Ambulatory Visit: Payer: Self-pay

## 2021-03-07 VITALS — BP 100/70 | HR 62 | Ht 62.5 in | Wt 159.0 lb

## 2021-03-07 DIAGNOSIS — R7309 Other abnormal glucose: Secondary | ICD-10-CM

## 2021-03-07 DIAGNOSIS — D219 Benign neoplasm of connective and other soft tissue, unspecified: Secondary | ICD-10-CM | POA: Diagnosis not present

## 2021-03-07 DIAGNOSIS — N946 Dysmenorrhea, unspecified: Secondary | ICD-10-CM | POA: Diagnosis not present

## 2021-03-07 NOTE — Patient Instructions (Signed)
Uterine Fibroids  Uterine fibroids, also called leiomyomas, are noncancerous (benign) tumors that can grow in the uterus. They can cause heavy menstrual bleeding and pain. Fibroids may also grow in the fallopian tubes, cervix, or tissues (ligaments) near the uterus. You may have one or many fibroids. Fibroids vary in size, weight, and where they grow in the uterus. Some can become quite large. Most fibroids do notrequire medical treatment. What are the causes? The cause of this condition is not known. What increases the risk? You are more likely to develop this condition if you: Are in your 30s or 40s and have not gone through menopause. Have a family history of this condition. Are of African American descent. Started your menstrual period at age 6 or younger. Have never given birth. Are overweight or obese. What are the signs or symptoms? Many women do not have any symptoms. Symptoms of this condition may include: Heavy menstrual bleeding. Bleeding between menstrual periods. Pain and pressure in the pelvic area, between your hip bones. Pain during sex. Bladder problems, such as needing to urinate right away or more often than usual. Inability to have children (infertility). Failure to carry pregnancy to term (miscarriage). How is this diagnosed? This condition may be diagnosed based on: Your symptoms and medical history. A physical exam. A pelvic exam that includes feeling for any tumors. Imaging tests, such as ultrasound or MRI. How is this treated? Treatment for this condition may include follow-up visits with your health care provider to monitor your fibroids for any changes. Other treatment may include: Medicines, such as: Medicines to relieve pain, including aspirin and NSAIDs, such as ibuprofen or naproxen. Hormone therapy. Treatment may be given as a pill or an injection, or it may be inserted into the uterus using an intrauterine device (IUD). Surgery that would do one of  the following: Remove the fibroids (myomectomy). This may be recommended if fibroids affect your fertility and you want to become pregnant. Remove the uterus (hysterectomy). Block the blood supply to the fibroids (uterine artery embolization). This can cause them to shrink and die. Follow these instructions at home: Medicines Take over-the-counter and prescription medicines only as told by your health care provider. Ask your health care provider if you should take iron pills or eat more iron-rich foods, such as dark green, leafy vegetables. Heavy menstrual bleeding can cause low iron levels. Managing pain If directed, apply heat to your back or abdomen to reduce pain. Use the heat source that your health care provider recommends, such as a moist heat pack or a heating pad. To apply heat: Place a towel between your skin and the heat source. Leave the heat on for 20-30 minutes. Remove the heat if your skin turns bright red. This is especially important if you are unable to feel pain, heat, or cold. You may have a greater risk of getting burned.  General instructions Pay close attention to your menstrual cycle. Tell your health care provider about any changes, such as: Heavier bleeding that requires you to change your pads or tampons more than usual. A change in the number of days that your menstrual period lasts. A change in symptoms that come with your menstrual period, such as back pain or cramps in your abdomen. Keep all follow-up visits. This is important, especially if your fibroids need to be monitored for any changes. Contact a health care provider if you: Have pelvic pain, back pain, or cramps in your abdomen that do not get better with medicine  or heat. Develop new bleeding between menstrual periods. Have increased bleeding during or between menstrual periods. Feel more tired or weak than usual. Feel light-headed. Get help right away if you: Faint. Have pelvic pain that suddenly  gets worse. Have severe vaginal bleeding that soaks a tampon or pad in 30 minutes or less. Summary Uterine fibroids are noncancerous (benign) tumors that can develop in the uterus. The exact cause of this condition is not known. Most fibroids do not require medical treatment unless they affect your ability to have children (fertility). Contact a health care provider if you have pelvic pain, back pain, or cramps in your abdomen that do not get better with medicines. Get help right away if you faint, have pelvic pain that suddenly gets worse, or have severe vaginal bleeding. This information is not intended to replace advice given to you by your health care provider. Make sure you discuss any questions you have with your healthcare provider. Document Revised: 02/07/2020 Document Reviewed: 02/07/2020 Elsevier Patient Education  2022 [employer].  Total Laparoscopic Hysterectomy A total laparoscopic hysterectomy is a minimally invasive surgery to remove the uterus and cervix. The fallopian tubes and ovaries can also be removed during this surgery, if necessary. This procedure may be done to treat problems such as: Growths in the uterus (uterine fibroids) that are not cancer but cause symptoms. A condition that causes the lining of the uterus to grow in other areas (endometriosis). Problems with pelvic support. Cancer of the cervix, ovaries, uterus, or tissue that lines the uterus (endometrium). Excessive bleeding in the uterus. After this procedure, you will no longer be able to have a baby, and you willno longer have a menstrual period. Tell a health care provider about: Any allergies you have. All medicines you are taking, including vitamins, herbs, eye drops, creams, and over-the-counter medicines. Any problems you or family members have had with anesthetic medicines. Any blood disorders you have. Any surgeries you have had. Any medical conditions you have. Whether you are pregnant or may  be pregnant. What are the risks? Generally, this is a safe procedure. However, problems may occur, including: Infection. Bleeding. Blood clots in the legs or lungs. Allergic reactions to medicines. Damage to nearby structures or organs. Having to change from this surgery to one in which a large incision is made in the abdomen (abdominal hysterectomy). What happens before the procedure? Staying hydrated Follow instructions from your health care provider about hydration, which may include: Up to 2 hours before the procedure - you may continue to drink clear liquids, such as water, clear fruit juice, black coffee, and plain tea.  Eating and drinking restrictions Follow instructions from your health care provider about eating and drinking, which may include: 8 hours before the procedure - stop eating heavy meals or foods, such as meat, fried foods, or fatty foods. 6 hours before the procedure - stop eating light meals or foods, such as toast or cereal. 6 hours before the procedure - stop drinking milk or drinks that contain milk. 2 hours before the procedure - stop drinking clear liquids. Medicines Ask your health care provider about: Changing or stopping your regular medicines. This is especially important if you are taking diabetes medicines or blood thinners. Taking medicines such as aspirin and ibuprofen. These medicines can thin your blood. Do not take these medicines unless your health care provider tells you to take them. Taking over-the-counter medicines, vitamins, herbs, and supplements. You may be asked to take medicine that helps you  have a bowel movement (laxative) to prevent constipation. General instructions If you were asked to do bowel preparation before the procedure, follow instructions from your health care provider. This procedure can affect the way you feel about yourself. Talk with your health care provider about the physical and emotional changes hysterectomy may  cause. Do not use any products that contain nicotine or tobacco for at least 4 weeks before the procedure. These products include cigarettes, chewing tobacco, and vaping devices, such as e-cigarettes. If you need help quitting, ask your health care provider. Plan to have a responsible adult take you home from the hospital or clinic. Plan to have a responsible adult care for you for the time you are told after you leave the hospital or clinic. This is important. Surgery safety Ask your health care provider: How your surgery site will be marked. What steps will be taken to help prevent infection. These may include: Removing hair at the surgery site. Washing skin with a germ-killing soap. Receiving antibiotic medicine. What happens during the procedure? An IV will be inserted into one of your veins. You will be given one or more of the following: A medicine to help you relax (sedative). A medicine to make you fall asleep (general anesthetic). A medicine to numb the area (local anesthetic). A medicine that is injected into your spine to numb the area below and slightly above the injection site (spinal anesthetic). A medicine that is injected into an area of your body to numb everything below the injection site (regional anesthetic). A gas will be used to inflate your abdomen. This will allow your surgeon to look inside your abdomen and do the surgery. Three or four small incisions will be made in your abdomen. A small device with a light (laparoscope) will be inserted into one of your incisions. Surgical instruments will be inserted through the other incisions in order to perform the procedure. Your uterus and cervix may be removed through your vagina or cut into small pieces and removed through the small incisions. Any other organs that need to be removed will also be removed this way. The gas will be released from inside your abdomen. Your incisions will be closed with stitches (sutures), skin  glue, or adhesive strips. A bandage (dressing) may be placed over your incisions. The procedure may vary among health care providers and hospitals. What happens after the procedure? Your blood pressure, heart rate, breathing rate, and blood oxygen level will be monitored until you leave the hospital or clinic. You will be given medicine for pain as needed. You will be encouraged to walk as soon as possible. You will also use a device to help you breathe or do breathing exercises to keep your lungs clear. You may have to wear compression stockings. These stockings help to prevent blood clots and reduce swelling in your legs. You will need to wear a sanitary pad for vaginal discharge or bleeding. Summary Total laparoscopic hysterectomy is a procedure to remove your uterus, cervix, and sometimes the fallopian tubes and ovaries. This procedure can affect the way you feel about yourself. Talk with your health care provider about the physical and emotional changes hysterectomy may cause. After this procedure, you will no longer be able to have a baby, and you will no longer have a menstrual period. You will be given pain medicine to control discomfort after this procedure. Plan to have a responsible adult take you home from the hospital or clinic. This information is not intended to  replace advice given to you by your health care provider. Make sure you discuss any questions you have with your healthcare provider. Document Revised: 03/09/2020 Document Reviewed: 03/09/2020 Elsevier Patient Education  [place].

## 2021-03-07 NOTE — Telephone Encounter (Signed)
Please precert and schedule surgery.   Patient needs a total laparoscopic hysterectomy with bilateral salpingectomy, possible bilateral oophorectomy, cystoscopy at [facility].  She has dysmenorrhea and fibroids.   Time needed 2 hours, 45 minutes.  Assistant needed.   Preop needed.

## 2021-03-08 LAB — HEMOGLOBIN A1C
Hgb A1c MFr Bld: 5.5 %{Hb}
Mean Plasma Glucose: 111 mg/dL
eAG (mmol/L): 6.2 mmol/L

## 2021-03-12 ENCOUNTER — Encounter: Payer: Self-pay | Admitting: Obstetrics and Gynecology

## 2021-03-12 NOTE — Telephone Encounter (Signed)
I see Dr.Silva sent you a message regarding surgery.

## 2021-03-13 NOTE — Telephone Encounter (Signed)
Karel Jarvis [facility] (supporting Nunzio Cobbs, MD) 20 hours ago (2:44 PM)   KB Hi Dr. Quincy Simmonds I just wanted to let you know that my company is switching insurance companies. I will be under the [employer] insurance beginning on September 1st. I know we are trying to get me in as soon as possible so I didn't want there to be any issues. I can call the office with my new insurance card numbers September 1st. Sorry if this causes any inconvenience.

## 2021-03-13 NOTE — Telephone Encounter (Signed)
Spoke with patient. Reviewed surgery dates.  Patient request to proceed with surgery on 04/09/21. Advised I will forward to business office to review benefits. Patient will provide updated insurance information when received. Patient agreeable.   Routing to [employer].

## 2021-03-13 NOTE — Telephone Encounter (Signed)
See open telephone encounter dated 03/07/21.   Encounter closed.

## 2021-03-13 NOTE — Telephone Encounter (Signed)
Surgery request sent.  

## 2021-03-19 ENCOUNTER — Ambulatory Visit
Admission: RE | Admit: 2021-03-19 | Discharge: 2021-03-19 | Disposition: A | Discharge status: Home / Self Care | Payer: 59 | Source: Ambulatory Visit

## 2021-03-19 ENCOUNTER — Other Ambulatory Visit: Payer: Self-pay

## 2021-03-19 DIAGNOSIS — Z1231 Encounter for screening mammogram for malignant neoplasm of breast: Secondary | ICD-10-CM

## 2021-03-22 ENCOUNTER — Encounter: Payer: Self-pay | Admitting: Obstetrics and Gynecology

## 2021-03-22 NOTE — Telephone Encounter (Signed)
MyChart message to patient.  

## 2021-03-22 NOTE — Telephone Encounter (Signed)
See open telephone encounter dated 03/07/21.   Encounter closed.

## 2021-03-22 NOTE — Telephone Encounter (Signed)
Estevan Ryder T  P [facility] (supporting Nunzio Cobbs, MD) 1 hour ago (11:00 AM)   KB Hi Dr. Quincy Simmonds, just following up to make sure my surgery is on September 20th. It still doesn't look like a definitive date in my my chart. I was contacted about getting lab work done on the 16th so I thought maybe it really is happening on the 20th. Anyway, I'd like to let my job know a definitive date as soon as possible. Please let me know if this is a sure thing (fingers crossed) have a great day!

## 2021-03-26 NOTE — Telephone Encounter (Signed)
Spoke with patient regarding surgery benefits. Patient acknowledges understanding of information presented. Patient is aware that benefits presented are professional benefits only. See account note.

## 2021-03-27 ENCOUNTER — Encounter: Payer: Self-pay | Admitting: Obstetrics and Gynecology

## 2021-03-27 ENCOUNTER — Ambulatory Visit (INDEPENDENT_AMBULATORY_CARE_PROVIDER_SITE_OTHER): Payer: BC Managed Care – PPO | Admitting: Obstetrics and Gynecology

## 2021-03-27 ENCOUNTER — Other Ambulatory Visit: Payer: Self-pay

## 2021-03-27 VITALS — BP 100/66 | HR 74 | Ht 62.5 in | Wt 159.0 lb

## 2021-03-27 DIAGNOSIS — N946 Dysmenorrhea, unspecified: Secondary | ICD-10-CM

## 2021-03-27 DIAGNOSIS — D219 Benign neoplasm of connective and other soft tissue, unspecified: Secondary | ICD-10-CM

## 2021-03-27 NOTE — Progress Notes (Signed)
GYNECOLOGY  VISIT   HPI: 40 y.o.   Married  [place] American  female   [place] with Patient's last menstrual period was 02/28/2021.   here for pre-op exam.   Desires hysterectomy for dysmenorrhea.  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  She does not have pelvic pain outside of her period time.   Declines medical therapy.  Declines childbearing.   Pelvic US 03/07/21.  Uterus 8.92 x 7.69 x 4.5 cm.  8 fibroids:  3.16 cm, 2.66 cm. 1.02 cm, 1.14 cm. 1.21 cm, 1.35 cm, 2.42 cm, and 2.31 cm. Intramural and subserous.  EMS 4.18 mm. No masses.  Cavity not distorted by fibroids.  Ovaries normal with sparse follicles bilaterally.  No adnexal masses.  No free fluid.  Lost 25 pounds intentionally.   GYNECOLOGIC HISTORY: Patient's last menstrual period was 02/28/2021. 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 30 tablet 11   MAGNESIUM PO Take 1 tablet by mouth daily.     Omega-3  Fatty Acids (FISH OIL) 1000 MG CAPS Take 1 capsule by mouth daily.     OVER THE COUNTER MEDICATION [employer] Drinks once daily     No current facility-administered medications for this visit.     ALLERGIES: Statins and Tylenol with codeine #3 [acetaminophen-codeine]  Family History  Problem Relation Age of Onset   Diabetes Maternal Grandmother    Heart disease Maternal Grandmother    Hypertension Maternal Grandmother    Breast cancer Neg Hx     Social History   Socioeconomic History   Marital status: Married    Spouse name: Not on file   Number of children: Not on file   Years of education: Not on file   Highest education level: Not on file  Occupational History   Not on file  Tobacco Use   Smoking status: Former   Smokeless tobacco: Never  Vaping Use   Vaping Use: Never used  Substance and Sexual Activity   Alcohol use: Not Currently   Drug use: No   Sexual activity: Yes    Birth control/protection: Other-see comments    Comment: Female partner  Other Topics Concern  Not on file  Social History Narrative   Not on file   Social Determinants of Health   Financial Resource Strain: Not on file  Food Insecurity: Not on file  Transportation Needs: Not on file  Physical Activity: Not on file  Stress: Not on file  Social Connections: Not on file  Intimate Partner Violence: Not on file    Review of Systems  All other systems reviewed and are negative.  PHYSICAL EXAMINATION:    BP 100/66   Pulse 74   Ht 5' 2.5" (1.588 m)   Wt 159 lb (72.1 kg)   LMP 02/28/2021   SpO2 100%   BMI 28.62 kg/m     General appearance: alert, cooperative and appears stated age Head: Normocephalic, without obvious abnormality, atraumatic Neck: no adenopathy, supple, symmetrical, trachea midline and thyroid normal to inspection and palpation Lungs: clear to auscultation bilaterally Breasts: normal appearance, no masses or tenderness, No nipple retraction or dimpling, No  nipple discharge or bleeding, No axillary or supraclavicular adenopathy Heart: regular rate and rhythm Abdomen: soft, non-tender, no masses,  no organomegaly Extremities: extremities normal, atraumatic, no cyanosis or edema Skin: Skin color, texture, turgor normal. No rashes or lesions Lymph nodes: Cervical, supraclavicular, and axillary nodes normal. No abnormal inguinal nodes palpated Neurologic: Grossly normal  Pelvic: External genitalia:  no lesions              Urethra:  normal appearing urethra with no masses, tenderness or lesions              Bartholins and Skenes: normal                 Vagina: normal appearing vagina with normal color and discharge, no lesions              Cervix: no lesions                Bimanual Exam:  Uterus:  9 week size, nontender.              Adnexa: no mass, fullness, tenderness    Chaperone was present for exam:  Estill Bamberg, CMA.  ASSESSMENT  Dysmenorrhea. Fibroids.   PLAN  Will proceed with total laparoscopic hysterectomy, bilateral salpingectomy, possible bilateral oophorectomy, possible vaginal morcellation of uterus in Alexis bag, cystoscopy.   Risks, benefits, and alternatives reviewed with the patient who wishes to proceed.  Surgical expectations and recovery discussed.    An After Visit Summary was printed and given to the patient.

## 2021-03-28 NOTE — Telephone Encounter (Signed)
Spoke with patient. Surgery date request confirmed.  Advised surgery is scheduled for 04/09/21 at [place], [facility].  Surgery instruction sheet and hospital brochure reviewed, printed copy will be mailed.  Patient advised if Covid screening and quarantine requirements and agreeable.   Routing to provider. Encounter closed.  Cc: Hayley Carder

## 2021-04-02 ENCOUNTER — Encounter (HOSPITAL_BASED_OUTPATIENT_CLINIC_OR_DEPARTMENT_OTHER): Payer: Self-pay | Admitting: Obstetrics and Gynecology

## 2021-04-02 ENCOUNTER — Other Ambulatory Visit: Payer: Self-pay

## 2021-04-02 ENCOUNTER — Other Ambulatory Visit: Payer: Self-pay | Admitting: Obstetrics and Gynecology

## 2021-04-02 DIAGNOSIS — M1990 Unspecified osteoarthritis, unspecified site: Secondary | ICD-10-CM

## 2021-04-02 HISTORY — DX: Unspecified osteoarthritis, unspecified site: M19.90

## 2021-04-02 NOTE — Progress Notes (Signed)
YOU ARE SCHEDULED FOR A COVID TEST ON   04-05-2021  . THIS TEST MUST BE DONE BEFORE SURGERY. GO TO  [facility] @ [place]   PHONE NUMBER [phone].   TURN LEFT AT THE SHIPPING AND RECEIVING SIGN AND LOOK FOR SMALL BLUE POP UP TENT UNDER BUILDING OVERHANG AT BACK OF BUILDING AND REMAIN IN YOUR CAR, THIS IS A DRIVE UP TEST.  AFTER YOUR COVID TEST , PLEASE WEAR A MASK OUT IN PUBLIC AND SOCIAL DISTANCE AND [place] YOUR HANDS FREQUENTLY. PLEASE ASK ALL YOUR CLOSE HOUSEHOLD CONTACT TO WEAR MASK OUT IN PUBLIC AND SOCIAL DISTANCE AND [place] HANDS FREQUENTLY ALSO.      Your procedure is scheduled on 04-09-2021  Report to [facility]   Call this number if you have problems the morning of surgery  [phone].   OUR ADDRESS IS [address]  WE ARE LOCATED IN [facility].  PLEASE BRING YOUR INSURANCE CARD AND PHOTO ID DAY OF SURGERY.  ONLY ONE PERSON ALLOWED IN FACILITY WAITING AREA.                                     REMEMBER:  DO NOT EAT FOOD, CANDY GUM OR MINTS  AFTER MIDNIGHT . YOU MAY HAVE CLEAR LIQUIDS FROM MIDNIGHT UNTIL 530 AM.. NO CLEAR LIQUIDS AFTER  530 AM DAY OF SURGERY.   YOU MAY  BRUSH YOUR TEETH MORNING OF SURGERY AND RINSE YOUR MOUTH OUT, NO CHEWING GUM CANDY OR MINTS.    CLEAR LIQUID DIET   Foods Allowed                                                                     Foods Excluded  Coffee and tea, regular and decaf                             liquids that you cannot  Plain Jell-O any favor except red or purple                                           see through such as: Fruit ices (not with fruit pulp)                                     milk, soups, orange juice  Iced Popsicles                                    All solid food Carbonated beverages, regular and diet                                    Cranberry, grape and apple juices Sports drinks like Gatorade Lightly  seasoned clear  broth or consume(fat free) Sugar  Sample Menu Breakfast                                Lunch                                     Supper Cranberry juice                    Beef broth                            Chicken broth Jell-O                                     Grape juice                           Apple juice Coffee or tea                        Jell-O                                      Popsicle                                                Coffee or tea                        Coffee or tea  _____________________________________________________________________     TAKE THESE MEDICATIONS MORNING OF SURGERY WITH A SIP OF WATER:  CERTRIZINE IF NEEDED  ONE VISITOR IS ALLOWED IN WAITING ROOM ONLY DAY OF SURGERY.  NO VISITOR MAY SPEND THE NIGHT.  VISITOR ARE ALLOWED TO STAY UNTIL 800 PM.                                    DO NOT WEAR JEWERLY, MAKE UP. DO NOT WEAR LOTIONS, POWDERS, PERFUMES OR NAIL POLISH. DO NOT SHAVE FOR 48 HOURS PRIOR TO DAY OF SURGERY. MEN MAY SHAVE FACE AND NECK. CONTACTS, GLASSES, OR DENTURES MAY NOT BE WORN TO SURGERY.                                    [facility] IS NOT RESPONSIBLE  FOR ANY BELONGINGS.                                                                    Marland Kitchen           [facility] - Preparing for Surgery Before surgery, you can play an important role.  Because skin is not sterile, your skin needs to be as free of germs as possible.  You can reduce the number of germs on your skin by washing with CHG (chlorahexidine gluconate) soap before surgery.  CHG is an antiseptic cleaner which kills germs and bonds with the skin to continue killing germs even after washing. Please DO NOT use if you have an allergy to CHG or antibacterial soaps.  If your skin becomes reddened/irritated stop using the CHG and inform your nurse when you arrive at Short Stay. Do not shave (including legs and underarms) for at least 48 hours prior to the first CHG  shower.  You may shave your face/neck. Please follow these instructions carefully:  1.  Shower with CHG Soap the night before surgery and the  morning of Surgery.  2.  If you choose to wash your hair, wash your hair first as usual with your  normal  shampoo.  3.  After you shampoo, rinse your hair and body thoroughly to remove the  shampoo.                            4.  Use CHG as you would any other liquid soap.  You can apply chg directly  to the skin and wash                      Gently with a scrungie or clean washcloth.  5.  Apply the CHG Soap to your body ONLY FROM THE NECK DOWN.   Do not use on face/ open                           Wound or open sores. Avoid contact with eyes, ears mouth and genitals (private parts).                       Wash face,  Genitals (private parts) with your normal soap.             6.  Wash thoroughly, paying special attention to the area where your surgery  will be performed.  7.  Thoroughly rinse your body with warm water from the neck down.  8.  DO NOT shower/wash with your normal soap after using and rinsing off  the CHG Soap.                9.  Pat yourself dry with a clean towel.            10.  Wear clean pajamas.            11.  Place clean sheets on your bed the night of your first shower and do not  sleep with pets. Day of Surgery : Do not apply any lotions/deodorants the morning of surgery.  Please wear clean clothes to the hospital/surgery center.  FAILURE TO FOLLOW THESE INSTRUCTIONS MAY RESULT IN THE CANCELLATION OF YOUR SURGERY PATIENT SIGNATURE_________________________________  NURSE SIGNATURE__________________________________  ________________________________________________________________________                                                        QUESTIONS Hansel Feinstein PRE OP NURSE PHONE [phone].

## 2021-04-02 NOTE — Progress Notes (Signed)
Spoke w/ via phone for pre-op interview---PT Lab needs dos----  urine poct (per anesthesia),             Lab results------cbc t & s , surgery orders pedning COVID test -----patient states asymptomatic no test needed Arrive at -------530 am 04-09-2021 NPO after MN NO Solid Food.  Clear liquids from MN until---430 am  Med rec completed Medications to take morning of surgery -----certrizine prn Diabetic medication -----n/a Patient instructed no nail polish to be worn day of surgery Patient instructed to bring photo id and insurance card day of surgery Patient aware to have Driver (ride ) / caregiver  Patty Garner   for 24 hours after surgery  Patient Special Instructions -----pt given extended stay instructions Pre-Op special Istructions -----surgery orders requested dr Quincy Simmonds epic ib Patient verbalized understanding of instructions that were given at this phone interview. Patient denies shortness of breath, chest pain, fever, cough at this phone interview.

## 2021-04-03 LAB — SARS CORONAVIRUS 2 (TAT 6-24 HRS): SARS Coronavirus 2: NEGATIVE

## 2021-04-05 ENCOUNTER — Encounter (HOSPITAL_COMMUNITY)
Admission: RE | Admit: 2021-04-05 | Discharge: 2021-04-05 | Disposition: A | Discharge status: Home / Self Care | Payer: BC Managed Care – PPO | Source: Ambulatory Visit | Attending: Obstetrics and Gynecology | Admitting: Obstetrics and Gynecology

## 2021-04-05 ENCOUNTER — Other Ambulatory Visit: Payer: Self-pay

## 2021-04-05 ENCOUNTER — Other Ambulatory Visit: Payer: Self-pay | Admitting: Obstetrics and Gynecology

## 2021-04-05 DIAGNOSIS — Z01812 Encounter for preprocedural laboratory examination: Secondary | ICD-10-CM | POA: Diagnosis not present

## 2021-04-05 LAB — BASIC METABOLIC PANEL
Anion gap: 7 (ref 5–15)
BUN: 8 mg/dL (ref 6–20)
CO2: 24 mmol/L (ref 22–32)
Calcium: 9.7 mg/dL (ref 8.9–10.3)
Chloride: 108 mmol/L (ref 98–111)
Creatinine, Ser: 0.64 mg/dL (ref 0.44–1.00)
GFR, Estimated: >60 mL/min (ref >60)
Glucose, Bld: 96 mg/dL (ref 70–99)
Potassium: 3.7 mmol/L (ref 3.5–5.1)
Sodium: 139 mmol/L (ref 135–145)

## 2021-04-05 LAB — CBC
HCT: 39.5 % (ref 36.0–46.0)
Hemoglobin: 12.9 g/dL (ref 12.0–15.0)
MCH: 29.3 pg (ref 26.0–34.0)
MCHC: 32.7 g/dL (ref 30.0–36.0)
MCV: 89.8 fL (ref 80.0–100.0)
Platelets: 233 10*3/uL (ref 150–400)
RBC: 4.4 MIL/uL (ref 3.87–5.11)
RDW: 12.7 % (ref 11.5–15.5)
WBC: 5.8 10*3/uL (ref 4.0–10.5)
nRBC: 0 % (ref 0.0–0.2)

## 2021-04-06 LAB — SARS CORONAVIRUS 2 (TAT 6-24 HRS): SARS Coronavirus 2: NEGATIVE

## 2021-04-08 NOTE — Anesthesia Preprocedure Evaluation (Addendum)
Anesthesia Evaluation  Patient identified by MRN, date of birth, ID band Patient awake    Reviewed: Allergy & Precautions, NPO status , Patient's Chart, lab work & pertinent test results  History of Anesthesia Complications Negative for: history of anesthetic complications  Airway Mallampati: III  TM Distance: >3 FB Neck ROM: Full    Dental  (+) Dental Advisory Given, Teeth Intact   Pulmonary former smoker,  04/05/2021 SARS coronavirus NEG   breath sounds clear to auscultation       Cardiovascular negative cardio ROS   Rhythm:Regular Rate:Normal     Neuro/Psych negative neurological ROS     GI/Hepatic negative GI ROS, Neg liver ROS,   Endo/Other  negative endocrine ROS  Renal/GU negative Renal ROS     Musculoskeletal   Abdominal   Peds  Hematology negative hematology ROS (+)   Anesthesia Other Findings   Reproductive/Obstetrics                            Anesthesia Physical Anesthesia Plan  ASA: 1  Anesthesia Plan: General   Post-op Pain Management:    Induction: Intravenous  PONV Risk Score and Plan: 3 and Ondansetron, Dexamethasone and Scopolamine patch - Pre-op  Airway Management Planned: Oral ETT  Additional Equipment: None  Intra-op Plan:   Post-operative Plan: Extubation in OR  Informed Consent: I have reviewed the patients History and Physical, chart, labs and discussed the procedure including the risks, benefits and alternatives for the proposed anesthesia with the patient or authorized representative who has indicated his/her understanding and acceptance.     Dental advisory given  Plan Discussed with: CRNA and Surgeon  Anesthesia Plan Comments:        Anesthesia Quick Evaluation

## 2021-04-08 NOTE — H&P (Signed)
Office Visit 03/27/2021 [facility], Everardo All, MD Obstetrics and Gynecology Dysmenorrhea +1 more Dx Pre-op Exam ; Referred by Flossie Buffy, NP Reason for Visit   Additional Documentation  Vitals:  BP 100/66 Pulse 74 Ht 5' 2.5" (1.588 m) Wt 72.1 kg LMP 02/28/2021 SpO2 100% BMI 28.62 kg/m BSA 1.78 m  Flowsheets:  NEWS, MEWS Score, Anthropometrics   Encounter Info:  Billing Info, History, Allergies, Detailed Report    All Notes    Progress Notes by Nunzio Cobbs, MD at 03/27/2021 4:00 PM  Author: Nunzio Cobbs, MD Author Type: Physician Filed: 03/28/2021  8:16 AM  Note Status: Signed Cosign: Cosign Not Required Encounter Date: 03/27/2021  Editor: Nunzio Cobbs, MD (Physician)      Prior Versions: 1. Lowella Fairy, CMA (Certified Medical Assistant) at 03/27/2021  4:11 PM - Sign when Signing Visit    GYNECOLOGY  VISIT   HPI: 40 y.o.   Married  [place] American  female   [place] with Patient's last menstrual period was 02/28/2021.   here for pre-op exam.    Desires hysterectomy for dysmenorrhea.  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  She does not have pelvic pain outside of her period time.    Declines medical therapy.  Declines childbearing.    Pelvic US 03/07/21.  Uterus 8.92 x 7.69 x 4.5 cm.  8 fibroids:  3.16 cm, 2.66 cm. 1.02 cm, 1.14 cm. 1.21 cm, 1.35 cm, 2.42 cm, and 2.31 cm. Intramural and subserous.  EMS 4.18 mm. No masses.  Cavity not distorted by fibroids.  Ovaries normal with sparse follicles bilaterally.  No adnexal masses.  No free fluid.   Lost 25 pounds intentionally.    GYNECOLOGIC HISTORY: Patient's last menstrual period was 02/28/2021. 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 30 tablet 11   MAGNESIUM PO Take  1 tablet by mouth daily.       Omega-3 Fatty Acids (FISH OIL) 1000 MG CAPS Take 1 capsule by mouth daily.       OVER THE COUNTER MEDICATION [place] Drinks once daily        No current facility-administered medications for this visit.      ALLERGIES: Statins and Tylenol with codeine #3 [acetaminophen-codeine]        Family History  Problem Relation Age of Onset   Diabetes Maternal Grandmother     Heart disease Maternal Grandmother     Hypertension Maternal Grandmother     Breast cancer Neg Hx        Social History         Socioeconomic History   Marital status: Married      Spouse name: Not on file   Number of children: Not on file   Years of education: Not on file   Highest education level: Not on file  Occupational History   Not on file  Tobacco Use   Smoking status: Former   Smokeless tobacco: Never  Vaping Use   Vaping Use: Never used  Substance and Sexual Activity   Alcohol use: Not Currently   Drug use: No   Sexual activity: Yes      Birth  control/protection: Other-see comments      Comment: Female partner  Other Topics Concern   Not on file  Social History Narrative   Not on file    Social Determinants of Health    Financial Resource Strain: Not on file  Food Insecurity: Not on file  Transportation Needs: Not on file  Physical Activity: Not on file  Stress: Not on file  Social Connections: Not on file  Intimate Partner Violence: Not on file      Review of Systems  All other systems reviewed and are negative.   PHYSICAL EXAMINATION:     BP 100/66   Pulse 74   Ht 5' 2.5" (1.588 m)   Wt 159 lb (72.1 kg)   LMP 02/28/2021   SpO2 100%   BMI 28.62 kg/m     General appearance: alert, cooperative and appears stated age Head: Normocephalic, without obvious abnormality, atraumatic Neck: no adenopathy, supple, symmetrical, trachea midline and thyroid normal to inspection and palpation Lungs: clear to auscultation bilaterally Breasts: normal appearance, no masses or tenderness, No nipple retraction or dimpling, No nipple discharge or bleeding, No axillary or supraclavicular adenopathy Heart: regular rate and rhythm Abdomen: soft, non-tender, no masses,  no organomegaly Extremities: extremities normal, atraumatic, no cyanosis or edema Skin: Skin color, texture, turgor normal. No rashes or lesions Lymph nodes: Cervical, supraclavicular, and axillary nodes normal. No abnormal inguinal nodes palpated Neurologic: Grossly normal   Pelvic: External genitalia:  no lesions              Urethra:  normal appearing urethra with no masses, tenderness or lesions              Bartholins and Skenes: normal                 Vagina: normal appearing vagina with normal color and discharge, no lesions              Cervix: no lesions                Bimanual Exam:  Uterus:  9 week size, nontender.              Adnexa:  no mass, fullness, tenderness     Chaperone was present for exam:  Estill Bamberg, CMA.   ASSESSMENT    Dysmenorrhea. Fibroids.    PLAN   Will proceed with total laparoscopic hysterectomy, bilateral salpingectomy, possible bilateral oophorectomy, possible vaginal morcellation of uterus in Alexis bag, cystoscopy.   Risks, benefits, and alternatives reviewed with the patient who wishes to proceed.  Surgical expectations and recovery discussed.    An After Visit Summary was printed and given to the patient.

## 2021-04-09 ENCOUNTER — Other Ambulatory Visit: Payer: Self-pay

## 2021-04-09 ENCOUNTER — Ambulatory Visit (HOSPITAL_BASED_OUTPATIENT_CLINIC_OR_DEPARTMENT_OTHER): Payer: BC Managed Care – PPO | Admitting: Anesthesiology

## 2021-04-09 ENCOUNTER — Observation Stay (HOSPITAL_BASED_OUTPATIENT_CLINIC_OR_DEPARTMENT_OTHER)
Admission: RE | Admit: 2021-04-09 | Discharge: 2021-04-09 | Disposition: A | Discharge status: Home / Self Care | Payer: BC Managed Care – PPO | Source: Ambulatory Visit | Attending: Obstetrics and Gynecology | Admitting: Obstetrics and Gynecology

## 2021-04-09 ENCOUNTER — Encounter (HOSPITAL_BASED_OUTPATIENT_CLINIC_OR_DEPARTMENT_OTHER): Payer: Self-pay | Admitting: Obstetrics and Gynecology

## 2021-04-09 ENCOUNTER — Encounter (HOSPITAL_BASED_OUTPATIENT_CLINIC_OR_DEPARTMENT_OTHER)
Admission: RE | Disposition: A | Discharge status: Home / Self Care | Payer: Self-pay | Source: Ambulatory Visit | Attending: Obstetrics and Gynecology

## 2021-04-09 DIAGNOSIS — N838 Other noninflammatory disorders of ovary, fallopian tube and broad ligament: Secondary | ICD-10-CM | POA: Diagnosis not present

## 2021-04-09 DIAGNOSIS — D251 Intramural leiomyoma of uterus: Secondary | ICD-10-CM | POA: Diagnosis not present

## 2021-04-09 DIAGNOSIS — N803 Endometriosis of pelvic peritoneum: Secondary | ICD-10-CM | POA: Diagnosis not present

## 2021-04-09 DIAGNOSIS — Z79899 Other long term (current) drug therapy: Secondary | ICD-10-CM | POA: Diagnosis not present

## 2021-04-09 DIAGNOSIS — D259 Leiomyoma of uterus, unspecified: Principal | ICD-10-CM | POA: Insufficient documentation

## 2021-04-09 DIAGNOSIS — N800 Endometriosis of uterus: Secondary | ICD-10-CM | POA: Diagnosis not present

## 2021-04-09 DIAGNOSIS — D219 Benign neoplasm of connective and other soft tissue, unspecified: Secondary | ICD-10-CM | POA: Diagnosis not present

## 2021-04-09 DIAGNOSIS — Z87891 Personal history of nicotine dependence: Secondary | ICD-10-CM | POA: Insufficient documentation

## 2021-04-09 DIAGNOSIS — J309 Allergic rhinitis, unspecified: Secondary | ICD-10-CM | POA: Diagnosis not present

## 2021-04-09 DIAGNOSIS — N946 Dysmenorrhea, unspecified: Secondary | ICD-10-CM | POA: Diagnosis not present

## 2021-04-09 DIAGNOSIS — Z90710 Acquired absence of both cervix and uterus: Secondary | ICD-10-CM | POA: Diagnosis present

## 2021-04-09 DIAGNOSIS — E7800 Pure hypercholesterolemia, unspecified: Secondary | ICD-10-CM | POA: Diagnosis not present

## 2021-04-09 HISTORY — PX: TOTAL LAPAROSCOPIC HYSTERECTOMY WITH SALPINGECTOMY: SHX6742

## 2021-04-09 HISTORY — PX: CYSTOSCOPY: SHX5120

## 2021-04-09 LAB — CBC
HCT: 37.5 % (ref 36.0–46.0)
Hemoglobin: 12.6 g/dL (ref 12.0–15.0)
MCH: 29.7 pg (ref 26.0–34.0)
MCHC: 33.6 g/dL (ref 30.0–36.0)
MCV: 88.4 fL (ref 80.0–100.0)
Platelets: 241 10*3/uL (ref 150–400)
RBC: 4.24 MIL/uL (ref 3.87–5.11)
RDW: 12.9 % (ref 11.5–15.5)
WBC: 9.5 10*3/uL (ref 4.0–10.5)
nRBC: 0 % (ref 0.0–0.2)

## 2021-04-09 LAB — ABO/RH: ABO/RH(D): A POS

## 2021-04-09 LAB — TYPE AND SCREEN
ABO/RH(D): A POS
Antibody Screen: NEGATIVE

## 2021-04-09 LAB — POCT PREGNANCY, URINE: Preg Test, Ur: NEGATIVE

## 2021-04-09 SURGERY — HYSTERECTOMY, TOTAL, LAPAROSCOPIC, WITH SALPINGECTOMY
Anesthesia: General

## 2021-04-09 MED ORDER — KETOROLAC TROMETHAMINE 30 MG/ML IJ SOLN
INTRAMUSCULAR | Status: AC
  Filled 2021-04-09: qty 1

## 2021-04-09 MED ORDER — LACTATED RINGERS IV SOLN: INTRAVENOUS | Status: DC

## 2021-04-09 MED ORDER — SODIUM CHLORIDE 0.9 % IV SOLN
2.0000 g | INTRAVENOUS | Status: AC
  Administered 2021-04-09: 2 g via INTRAVENOUS

## 2021-04-09 MED ORDER — INDIGOTINDISULFONATE SODIUM 8 MG/ML IJ SOLN
INTRAMUSCULAR | Status: AC
  Filled 2021-04-09: qty 5

## 2021-04-09 MED ORDER — ROCURONIUM BROMIDE 10 MG/ML (PF) SYRINGE
PREFILLED_SYRINGE | INTRAVENOUS | Status: DC | PRN
  Administered 2021-04-09: 10 mg via INTRAVENOUS
  Administered 2021-04-09: 60 mg via INTRAVENOUS

## 2021-04-09 MED ORDER — PHENYLEPHRINE 40 MCG/ML (10ML) SYRINGE FOR IV PUSH (FOR BLOOD PRESSURE SUPPORT)
PREFILLED_SYRINGE | INTRAVENOUS | Status: DC | PRN
  Administered 2021-04-09 (×4): 80 ug via INTRAVENOUS

## 2021-04-09 MED ORDER — POVIDONE-IODINE 10 % EX SWAB
2.0000 "application " | Freq: Once | CUTANEOUS | Status: AC
  Administered 2021-04-09: 2 via TOPICAL

## 2021-04-09 MED ORDER — PHENYLEPHRINE 40 MCG/ML (10ML) SYRINGE FOR IV PUSH (FOR BLOOD PRESSURE SUPPORT)
PREFILLED_SYRINGE | INTRAVENOUS | Status: AC
  Filled 2021-04-09: qty 10

## 2021-04-09 MED ORDER — DEXAMETHASONE SODIUM PHOSPHATE 10 MG/ML IJ SOLN
INTRAMUSCULAR | Status: DC | PRN
  Administered 2021-04-09: 10 mg via INTRAVENOUS

## 2021-04-09 MED ORDER — HYDROMORPHONE HCL 1 MG/ML IJ SOLN
0.2500 mg | INTRAMUSCULAR | Status: DC | PRN
  Administered 2021-04-09 (×2): 0.25 mg via INTRAVENOUS

## 2021-04-09 MED ORDER — PROPOFOL 10 MG/ML IV BOLUS
INTRAVENOUS | Status: DC | PRN
  Administered 2021-04-09: 200 mg via INTRAVENOUS

## 2021-04-09 MED ORDER — MIDAZOLAM HCL 5 MG/5ML IJ SOLN
INTRAMUSCULAR | Status: DC | PRN
  Administered 2021-04-09: 2 mg via INTRAVENOUS

## 2021-04-09 MED ORDER — ACETAMINOPHEN 500 MG PO TABS
1000.0000 mg | ORAL_TABLET | Freq: Once | ORAL | Status: AC
  Administered 2021-04-09: 1000 mg via ORAL

## 2021-04-09 MED ORDER — HYDROCODONE-ACETAMINOPHEN 5-325 MG PO TABS
ORAL_TABLET | ORAL | Status: AC
  Filled 2021-04-09: qty 1

## 2021-04-09 MED ORDER — ROCURONIUM BROMIDE 10 MG/ML (PF) SYRINGE
PREFILLED_SYRINGE | INTRAVENOUS | Status: AC
  Filled 2021-04-09: qty 10

## 2021-04-09 MED ORDER — KETOROLAC TROMETHAMINE 30 MG/ML IJ SOLN
INTRAMUSCULAR | Status: DC | PRN
  Administered 2021-04-09: 30 mg via INTRAVENOUS

## 2021-04-09 MED ORDER — LIDOCAINE 2% (20 MG/ML) 5 ML SYRINGE
INTRAMUSCULAR | Status: DC | PRN
  Administered 2021-04-09: 20 mg via INTRAVENOUS

## 2021-04-09 MED ORDER — PROMETHAZINE HCL 25 MG/ML IJ SOLN: 6.2500 mg | INTRAMUSCULAR | Status: DC | PRN

## 2021-04-09 MED ORDER — ONDANSETRON HCL 4 MG/2ML IJ SOLN: 4.0000 mg | Freq: Four times a day (QID) | INTRAMUSCULAR | Status: DC | PRN

## 2021-04-09 MED ORDER — GLYCOPYRROLATE PF 0.2 MG/ML IJ SOSY
PREFILLED_SYRINGE | INTRAMUSCULAR | Status: DC | PRN
  Administered 2021-04-09: .2 mg via INTRAVENOUS

## 2021-04-09 MED ORDER — GLYCOPYRROLATE PF 0.2 MG/ML IJ SOSY
PREFILLED_SYRINGE | INTRAMUSCULAR | Status: AC
  Filled 2021-04-09: qty 1

## 2021-04-09 MED ORDER — IBUPROFEN 800 MG PO TABS: 800.0000 mg | ORAL_TABLET | Freq: Three times a day (TID) | ORAL | 0 refills | Status: DC | PRN

## 2021-04-09 MED ORDER — VASOPRESSIN 20 UNIT/ML IV SOLN
INTRAVENOUS | Status: AC
  Filled 2021-04-09: qty 1

## 2021-04-09 MED ORDER — HYDROCODONE-ACETAMINOPHEN 5-325 MG PO TABS: 1.0000 | ORAL_TABLET | ORAL | 0 refills | Status: DC | PRN

## 2021-04-09 MED ORDER — BUPIVACAINE HCL (PF) 0.25 % IJ SOLN
INTRAMUSCULAR | Status: AC
  Filled 2021-04-09: qty 30

## 2021-04-09 MED ORDER — MORPHINE SULFATE (PF) 4 MG/ML IV SOLN: 1.0000 mg | INTRAVENOUS | Status: DC | PRN

## 2021-04-09 MED ORDER — SODIUM CHLORIDE 0.9 % IV SOLN
INTRAVENOUS | Status: DC | PRN
  Administered 2021-04-09: 60 mL

## 2021-04-09 MED ORDER — ENOXAPARIN SODIUM 40 MG/0.4ML IJ SOSY
40.0000 mg | PREFILLED_SYRINGE | INTRAMUSCULAR | Status: AC
  Administered 2021-04-09: 40 mg via SUBCUTANEOUS

## 2021-04-09 MED ORDER — PROPOFOL 10 MG/ML IV BOLUS
INTRAVENOUS | Status: AC
  Filled 2021-04-09: qty 20

## 2021-04-09 MED ORDER — MIDAZOLAM HCL 2 MG/2ML IJ SOLN: 0.5000 mg | Freq: Once | INTRAMUSCULAR | Status: DC | PRN

## 2021-04-09 MED ORDER — ENOXAPARIN SODIUM 40 MG/0.4ML IJ SOSY
PREFILLED_SYRINGE | INTRAMUSCULAR | Status: AC
  Filled 2021-04-09: qty 0.4

## 2021-04-09 MED ORDER — FENTANYL CITRATE (PF) 100 MCG/2ML IJ SOLN
INTRAMUSCULAR | Status: DC | PRN
  Administered 2021-04-09: 150 ug via INTRAVENOUS
  Administered 2021-04-09 (×2): 50 ug via INTRAVENOUS

## 2021-04-09 MED ORDER — OXYCODONE HCL 5 MG/5ML PO SOLN: 5.0000 mg | Freq: Once | ORAL | Status: AC | PRN

## 2021-04-09 MED ORDER — KETOROLAC TROMETHAMINE 30 MG/ML IJ SOLN
30.0000 mg | Freq: Four times a day (QID) | INTRAMUSCULAR | Status: DC
  Administered 2021-04-09: 30 mg via INTRAVENOUS

## 2021-04-09 MED ORDER — BUPIVACAINE HCL (PF) 0.25 % IJ SOLN
INTRAMUSCULAR | Status: DC | PRN
  Administered 2021-04-09: 20 mL

## 2021-04-09 MED ORDER — HYDROMORPHONE HCL 1 MG/ML IJ SOLN
INTRAMUSCULAR | Status: AC
  Filled 2021-04-09: qty 1

## 2021-04-09 MED ORDER — MEPERIDINE HCL 25 MG/ML IJ SOLN: 6.2500 mg | INTRAMUSCULAR | Status: DC | PRN

## 2021-04-09 MED ORDER — SCOPOLAMINE 1 MG/3DAYS TD PT72
1.0000 | MEDICATED_PATCH | TRANSDERMAL | Status: DC
  Administered 2021-04-09: 1.5 mg via TRANSDERMAL

## 2021-04-09 MED ORDER — ONDANSETRON HCL 4 MG PO TABS: 4.0000 mg | ORAL_TABLET | Freq: Four times a day (QID) | ORAL | Status: DC | PRN

## 2021-04-09 MED ORDER — HYDROCODONE-ACETAMINOPHEN 5-325 MG PO TABS
1.0000 | ORAL_TABLET | ORAL | Status: DC | PRN
  Administered 2021-04-09: 1 via ORAL

## 2021-04-09 MED ORDER — IBUPROFEN 800 MG PO TABS: 800.0000 mg | ORAL_TABLET | Freq: Three times a day (TID) | ORAL | Status: DC | PRN

## 2021-04-09 MED ORDER — SODIUM CHLORIDE 0.9 % IV SOLN
INTRAVENOUS | Status: AC
  Filled 2021-04-09: qty 2

## 2021-04-09 MED ORDER — DEXAMETHASONE SODIUM PHOSPHATE 10 MG/ML IJ SOLN
INTRAMUSCULAR | Status: AC
  Filled 2021-04-09: qty 1

## 2021-04-09 MED ORDER — ACETAMINOPHEN 500 MG PO TABS
ORAL_TABLET | ORAL | Status: AC
  Filled 2021-04-09: qty 2

## 2021-04-09 MED ORDER — SCOPOLAMINE 1 MG/3DAYS TD PT72
MEDICATED_PATCH | TRANSDERMAL | Status: AC
  Filled 2021-04-09: qty 1

## 2021-04-09 MED ORDER — ONDANSETRON HCL 4 MG/2ML IJ SOLN
INTRAMUSCULAR | Status: AC
  Filled 2021-04-09: qty 2

## 2021-04-09 MED ORDER — SUGAMMADEX SODIUM 200 MG/2ML IV SOLN
INTRAVENOUS | Status: DC | PRN
  Administered 2021-04-09: 130 mg via INTRAVENOUS

## 2021-04-09 MED ORDER — FENTANYL CITRATE (PF) 250 MCG/5ML IJ SOLN
INTRAMUSCULAR | Status: AC
  Filled 2021-04-09: qty 5

## 2021-04-09 MED ORDER — MENTHOL 3 MG MT LOZG: 1.0000 | LOZENGE | OROMUCOSAL | Status: DC | PRN

## 2021-04-09 MED ORDER — ROPIVACAINE HCL 5 MG/ML IJ SOLN
INTRAMUSCULAR | Status: AC
  Filled 2021-04-09: qty 30

## 2021-04-09 MED ORDER — OXYCODONE HCL 5 MG PO TABS
5.0000 mg | ORAL_TABLET | Freq: Once | ORAL | Status: AC | PRN
Start: 2021-04-09 — End: 2021-04-09
  Administered 2021-04-09: 5 mg via ORAL

## 2021-04-09 MED ORDER — MIDAZOLAM HCL 2 MG/2ML IJ SOLN
INTRAMUSCULAR | Status: AC
  Filled 2021-04-09: qty 2

## 2021-04-09 MED ORDER — ONDANSETRON HCL 4 MG/2ML IJ SOLN
INTRAMUSCULAR | Status: DC | PRN
  Administered 2021-04-09: 4 mg via INTRAVENOUS

## 2021-04-09 MED ORDER — OXYCODONE HCL 5 MG PO TABS
ORAL_TABLET | ORAL | Status: AC
  Filled 2021-04-09: qty 1

## 2021-04-09 SURGICAL SUPPLY — 65 items
ADH SKN CLS APL DERMABOND .7 (GAUZE/BANDAGES/DRESSINGS) ×2
APL SRG 38 LTWT LNG FL B (MISCELLANEOUS)
APPLICATOR ARISTA FLEXITIP XL (MISCELLANEOUS) IMPLANT
BAG DECANTER FOR FLEXI CONT (MISCELLANEOUS) ×1 IMPLANT
BARRIER ADHS 3X4 INTERCEED (GAUZE/BANDAGES/DRESSINGS) IMPLANT
BRR ADH 4X3 ABS CNTRL BYND (GAUZE/BANDAGES/DRESSINGS)
CABLE HIGH FREQUENCY MONO STRZ (ELECTRODE) IMPLANT
CELL SAVER LIPIGURD (MISCELLANEOUS) IMPLANT
COVER BACK TABLE 60X90IN (DRAPES) IMPLANT
COVER MAYO STAND STRL (DRAPES) ×3 IMPLANT
DECANTER SPIKE VIAL GLASS SM (MISCELLANEOUS) ×6 IMPLANT
DERMABOND ADVANCED (GAUZE/BANDAGES/DRESSINGS) ×1
DERMABOND ADVANCED .7 DNX12 (GAUZE/BANDAGES/DRESSINGS) ×2 IMPLANT
DEVICE RETRIEVAL ALEXIS 14 (MISCELLANEOUS) IMPLANT
DURAPREP 26ML APPLICATOR (WOUND CARE) ×3 IMPLANT
EXTRT SYSTEM ALEXIS 14CM (MISCELLANEOUS)
EXTRT SYSTEM ALEXIS 17CM (MISCELLANEOUS)
GAUZE 4X4 16PLY ~~LOC~~+RFID DBL (SPONGE) ×6 IMPLANT
GLOVE SURG ENC MOIS LTX SZ6.5 (GLOVE) ×6 IMPLANT
GLOVE SURG UNDER POLY LF SZ7 (GLOVE) ×3 IMPLANT
GOWN STRL REUS W/TWL LRG LVL3 (GOWN DISPOSABLE) ×12 IMPLANT
HEMOSTAT ARISTA ABSORB 3G PWDR (HEMOSTASIS) IMPLANT
HOLDER FOLEY CATH W/STRAP (MISCELLANEOUS) IMPLANT
KIT TURNOVER CYSTO (KITS) ×3 IMPLANT
LIGASURE VESSEL 5MM BLUNT TIP (ELECTROSURGICAL) ×3 IMPLANT
NEEDLE INSUFFLATION 120MM (ENDOMECHANICALS) ×3 IMPLANT
OCCLUDER COLPOPNEUMO (BALLOONS) ×3 IMPLANT
PACK LAPAROSCOPY BASIN (CUSTOM PROCEDURE TRAY) ×3 IMPLANT
PACK TRENDGUARD 450 HYBRID PRO (MISCELLANEOUS) IMPLANT
POUCH LAPAROSCOPIC INSTRUMENT (MISCELLANEOUS) ×3 IMPLANT
PROTECTOR NERVE ULNAR (MISCELLANEOUS) ×6 IMPLANT
RETRACTOR WOUND ALXS 19CM XSML (INSTRUMENTS) IMPLANT
RTRCTR WOUND ALEXIS 19CM XSML (INSTRUMENTS)
SCISSORS LAP 5X35 DISP (ENDOMECHANICALS) IMPLANT
SET IRRIG Y TYPE TUR BLADDER L (SET/KITS/TRAYS/PACK) ×3 IMPLANT
SET SUCTION IRRIG HYDROSURG (IRRIGATION / IRRIGATOR) ×3 IMPLANT
SET TRI-LUMEN FLTR TB AIRSEAL (TUBING) ×3 IMPLANT
SHEARS 1100 HARMONIC 36 (ELECTROSURGICAL) ×3 IMPLANT
SLEEVE ADV FIXATION 5X100MM (TROCAR) ×3 IMPLANT
SOL ANTI FOG 6CC (MISCELLANEOUS) IMPLANT
SOLUTION ANTI FOG 6CC (MISCELLANEOUS) ×1
SUT VIC AB 0 CT1 27 (SUTURE) ×6
SUT VIC AB 0 CT1 27XBRD ANBCTR (SUTURE) ×4 IMPLANT
SUT VIC AB 4-0 PS2 18 (SUTURE) ×3 IMPLANT
SUT VICRYL 0 UR6 27IN ABS (SUTURE) IMPLANT
SUT VLOC 180 0 9IN  GS21 (SUTURE) ×3
SUT VLOC 180 0 9IN GS21 (SUTURE) IMPLANT
SYR 10ML LL (SYRINGE) ×3 IMPLANT
SYR 50ML LL SCALE MARK (SYRINGE) ×6 IMPLANT
SYSTEM CARTER THOMASON II (TROCAR) IMPLANT
SYSTEM CONTND EXTRCTN KII BLLN (MISCELLANEOUS) IMPLANT
TIP RUMI ORANGE 6.7MMX12CM (TIP) IMPLANT
TIP UTERINE 5.1X6CM LAV DISP (MISCELLANEOUS) IMPLANT
TIP UTERINE 6.7X10CM GRN DISP (MISCELLANEOUS) IMPLANT
TIP UTERINE 6.7X6CM WHT DISP (MISCELLANEOUS) IMPLANT
TIP UTERINE 6.7X8CM BLUE DISP (MISCELLANEOUS) ×1 IMPLANT
TOWEL OR 17X26 10 PK STRL BLUE (TOWEL DISPOSABLE) ×6 IMPLANT
TRAY FOLEY W/BAG SLVR 14FR LF (SET/KITS/TRAYS/PACK) ×3 IMPLANT
TRENDGUARD 450 HYBRID PRO PACK (MISCELLANEOUS)
TROCAR ADV FIXATION 5X100MM (TROCAR) ×3 IMPLANT
TROCAR BLADELESS OPT 5 100 (ENDOMECHANICALS) ×3 IMPLANT
TROCAR PORT AIRSEAL 5X120 (TROCAR) ×3 IMPLANT
TROCAR PORT AIRSEAL 8X120 (TROCAR) ×1 IMPLANT
TROCAR XCEL NON BLADE 8MM B8LT (ENDOMECHANICALS) IMPLANT
WARMER LAPAROSCOPE (MISCELLANEOUS) ×3 IMPLANT

## 2021-04-09 NOTE — Discharge Instructions (Signed)
Patty Garner,   Your surgery went well today!  I will see you for your post op visit in one week.   [name], MD

## 2021-04-09 NOTE — Transfer of Care (Signed)
Immediate Anesthesia Transfer of Care Note  Patient: Patty Garner  Procedure(s) Performed: TOTAL LAPAROSCOPIC HYSTERECTOMY WITH BILATERAL SALPINGECTOMY, FULGURATION OF ENDOMETRIOSIS (Bilateral) CYSTOSCOPY  Patient Location: PACU  Anesthesia Type:General  Level of Consciousness: awake, alert , oriented and patient cooperative  Airway & Oxygen Therapy: Patient Spontanous Breathing and Patient connected to nasal cannula oxygen  Post-op Assessment: Report given to RN and Post -op Vital signs reviewed and stable  Post vital signs: Reviewed and stable  Last Vitals:  Vitals Value Taken Time  BP 113/68 04/09/21 0947  Temp 36.4 C 04/09/21 0947  Pulse 79 04/09/21 0952  Resp 16 04/09/21 0952  SpO2 100 % 04/09/21 0952  Vitals shown include unvalidated device data.  Last Pain:  Vitals:   04/09/21 0605  TempSrc: Oral  PainSc: 0-No pain         Complications: No notable events documented.

## 2021-04-09 NOTE — Anesthesia Postprocedure Evaluation (Signed)
Anesthesia Post Note  Patient: Patty Garner  Procedure(s) Performed: TOTAL LAPAROSCOPIC HYSTERECTOMY WITH BILATERAL SALPINGECTOMY, FULGURATION OF ENDOMETRIOSIS (Bilateral) CYSTOSCOPY     Patient location during evaluation: PACU Anesthesia Type: General Level of consciousness: patient cooperative, oriented and sedated Pain management: pain level controlled Vital Signs Assessment: post-procedure vital signs reviewed and stable Respiratory status: spontaneous breathing, nonlabored ventilation and respiratory function stable Cardiovascular status: blood pressure returned to baseline and stable Postop Assessment: no apparent nausea or vomiting Anesthetic complications: no   No notable events documented.  Last Vitals:  Vitals:   04/09/21 1030 04/09/21 1045  BP: 98/60 106/60  Pulse: (!) 45 (!) 47  Resp: 14 15  Temp:    SpO2: 100% 100%    Last Pain:  Vitals:   04/09/21 1050  TempSrc:   PainSc: 4                  Torsten Weniger,E. Rhilee Currin

## 2021-04-09 NOTE — Progress Notes (Signed)
Update to History and Physical  No marked change in status since office pre-op visit.   Patient examined.   OK to proceed with surgery. 

## 2021-04-09 NOTE — Op Note (Signed)
OPERATIVE REPORT   PREOPERATIVE DIAGNOSIS:    Dysmenorrhea, fibroids.    POSTOPERATIVE DIAGNOSIS:     Dysmenorrhea, fibroids, minimal endometriosis.   PROCEDURES:    Total laparoscopic hysterectomy with bilateral salpingectomy, fulguration of endometriosis, cystoscopy   SURGEON:    Lenard Galloway, M.D.   ASSISTANT:    Dorothy Spark, M.D.   ANESTHESIA:  General endotracheal, intraperitoneal ropivicaine 30 mL diluted in 30 mL of normal saline, local with 0.25% Marcaine.   IVF:   1000 cc LR.   ESTIMATED BLOOD LOSS:   30 cc.   URINE OUTPUT:   50 cc.   COMPLICATIONS:  None.   INDICATIONS FOR THE PROCEDURE:      The patient is a 40 year old G33P0 African American female who presents with dysmenorrhea and multiple fibroids on pelvic ultrasound.  She desires hysterectomy and declines future childbearing.  A plan is made to proceed with a total laparoscopic hysterectomy with bilateral salpingectomy, possible bilateral oophorectomy, and cystoscopy after risks, benefits, and alternatives are reviewed.   FINDINGS:      Exam under anesthesia reveals an 8 - 9 week irregular and mobile uterus.  No adnexal masses are noted.   Laparoscopy revealed a multifibroid uterus.  Her ovaries were normal.  The left fallopian tube had slight dilation along the distal portion.  The right fallopian looked consistent with a hydrosalpinx.  The liver was normal and the gallbladder appeared distended.  The appendix was normal.  There was no adhesive disease in the abdomen or pelvis.  There was a small amount of endometriosis along the left pelvic sidewall.    Cystoscopy at the termination of the procedure showed the bladder to be normal throughout 360 degrees including the bladder dome and trigone. There was no evidence of any foreign body in the bladder or the urethra. There was no evidence of any lesions  of the bladder or the urethra.   Both of the ureters were noted to be patent bilaterally.   SPECIMENS:       The uterus, cervix, and bilateral tubes were went to pathology.    DESCRIPTION OF PROCEDURE:     The patient was reidentified in the preoperative hold area.   She did receive Cefotetan IV for antibiotic prophylaxis.  She received Lovenox, TED hose, and PAS stockings for DVT prophylaxis.   In the operating room, the patient was placed in the dorsal lithotomy position on the operating room table.   Her legs were placed in the Tigard stirrups and her arms were both tucked at her sides. The Trendguard was used to support her shoulders.  The patient received general endotracheal anesthesia. The abdomen and vagina were then sterilely prepped and she was sterilely draped.   A speculum was placed in the vagina and a single-tooth tenaculum was placed on the anterior cervical lip.  A figure-of-eight suture of 0 Vicryl was placed on each the anterior and the posterior cervical lips. The uterus was sounded to 8 cm.  The cervix was then dilated with [facility] dilators.  A #8 RUMI tip with a KOH ring was then placed through the cervix and into the uterine cavity without difficulty.  The remaining vaginal instruments were then removed.  A Foley catheter was placed inside the bladder.   Attention was turned to the abdomen where the umbilical region was injected with 0.25% Marcaine and a small incision created.  A Veress needle was then used to insufflate the abdomen with CO2 gas after a  saline drop test was performed and the fluid flowed freely.  A 5 mm umbilical incision was created with a scalpel after the skin was injected with 0.25% Marcaine.   A 5 mm camera port was then placed using the Optiview.  An 8 mm incision was created in the left mid abdomen and a 5 mm incision was created in the left lower abdomen after the skin was injected locally with 0.25% Marcaine.  Respective sized trocars were then placed under visualization of the laparoscope.  A 5 mm Airseal trocar was placed in the right lower quadrant after  injecting with Marcaine and incising with a scalpel.    Ropivicine 30 cc diluted in 30 cc of normal saline was placed inside the peritoneal cavity.    The patient was placed in Trendelenburg position.  An inspection of the abdomen and pelvis was performed. The findings are as noted above.    The bilateral ureters were identified.  The left fallopian tube was grasped and the Ligasure was used to cauterize and cut through the mesosalpinx to the level of the uterus.  The fallopian tube was cauterized and bisected proximally and removed from the peritoneal cavity.  It was then sent to Pathology. The left utero-ovarian ligament was similarly cauterized and cut with the Ligasure.  The left round ligament was then cauterized and divided with same instrument.  Dissection was performed to the anterior and posterior leaves of the broad ligaments using the Harmonic scalpel.  The incision was carried across the anterior cul- de-sac along the vesicouterine fold and the bladder was dissected away from the cervix. The peritoneum was taken down posteriorly.  The left uterine artery was skeletonized at this time using sharp dissection and monopolar cautery.  It was then cauterized and cut with the Ligasure instrument.   Attention was turned to the patient's right-hand side at this time.  The same procedure that was performed on the left side was repeated on the right side with respect to the isolation, cautery, and transection of the vessels and the bladder flap dissection.  The right fallopian tube was left attached to the uterus proximally.    The KOH ring was nicely visible.  The colpotomy incision was performed with the Harmonic scalpel in a circumferential fashion. The specimen was then removed from the peritoneal cavity and was sent to Pathology.  The balloon occluder was placed in the vagina.  The vaginal cuff was sutured using a running suture of 0 V-Loc.  The vagina was closed from the patient's right hand side to  the left hand side and then back 2 sutures towards the midline.  This provided good full-thickness closure of the vaginal cuff.  The laparoscopic needle for suturing was removed from the peritoneal cavity.   The pelvis was irrigated and suctioned.  The pneumoperitoneal pressure was decreased to 8.  There was good hemostasis of the operative sites and pedicles.    The endometriosis of the left pelvic sidewall was fulgurated with the Harmonic scalpel.   The CO2 pneumoperitoneum was released.  The patient received manual breaths to remove any remaining CO2 gas and all trocars were removed.  The right and left trocar sites were closed with subcuticular sutures of 4/0 Vicryl.  Dermabond was placed over all of the incisions.   The vaginal occluder balloon was removed from the vagina.    The patient's Foley catheter was removed and cystoscopy was performed and the findings are as noted above.  The Foley catheter was  left out.    Final inspection of the vagina demonstrated good hemostasis of the vaginal cuff.  There was small lacerations of the left periurethral area and the posterior hymen.  These were sutured with simple sutures of 3/0 Vicryl.   This concluded the patient's procedure.  She was extubated and escorted to the recovery room in stable and awake condition.  There were no complications to the procedure.  All needle, instrument, and sponge counts were correct.  An MD assistant was necessary for tissue manipulation, management of instrumentation, retraction, and positioning due to the complexity of the case.  Lenard Galloway, M.D.

## 2021-04-09 NOTE — Progress Notes (Signed)
Resting quietly waiting for transfer to [facility] when staff ready to receive

## 2021-04-09 NOTE — Anesthesia Procedure Notes (Signed)
Procedure Name: Intubation Date/Time: 04/09/2021 7:37 AM Performed by: Rogers Blocker, CRNA Pre-anesthesia Checklist: Patient identified, Emergency Drugs available, Suction available and Patient being monitored Patient Re-evaluated:Patient Re-evaluated prior to induction Oxygen Delivery Method: Circle System Utilized Preoxygenation: Pre-oxygenation with 100% oxygen Induction Type: IV induction Ventilation: Mask ventilation without difficulty Laryngoscope Size: Mac and 3 Grade View: Grade I Tube type: Oral Number of attempts: 1 Airway Equipment and Method: Stylet Placement Confirmation: ETT inserted through vocal cords under direct vision, positive ETCO2 and breath sounds checked- equal and bilateral Secured at: 22 cm Tube secured with: Tape Dental Injury: Teeth and Oropharynx as per pre-operative assessment  Comments: Soft bite block after intubation.

## 2021-04-09 NOTE — Progress Notes (Signed)
Day of Surgery Procedure(s) (LRB): TOTAL LAPAROSCOPIC HYSTERECTOMY WITH BILATERAL SALPINGECTOMY, FULGURATION OF ENDOMETRIOSIS (Bilateral) CYSTOSCOPY (N/A)  Subjective: Patient reports tolerating PO and no problems voiding.   Patient is ambulating.  Good pain control with Vicodin and Toradol. Wants discharge home.  Objective: I have reviewed patient's vital signs, intake and output, and labs.  Vitals:   04/09/21 1325 04/09/21 1423  BP: 118/74 99/63  Pulse: 78 71  Resp: 14 16  Temp:  98.2 F (36.8 C)  SpO2: 100% 100%   I/O - 1460 cc/780 cc.  CBC    Component Value Date/Time   WBC 9.5 04/09/2021 1500   RBC 4.24 04/09/2021 1500   HGB 12.6 04/09/2021 1500   HCT 37.5 04/09/2021 1500   PLT 241 04/09/2021 1500   MCV 88.4 04/09/2021 1500   MCH 29.7 04/09/2021 1500   MCHC 33.6 04/09/2021 1500   RDW 12.9 04/09/2021 1500   LYMPHSABS 2.1 03/09/2019 0943   MONOABS 0.4 03/09/2019 0943   EOSABS 0.1 03/09/2019 0943   BASOSABS 0.0 03/09/2019 0943    GEN:  alert, talkative. LUNGS:  CTA bilaterally.  COR:  S1S2 RRR.  ABDOMEN:  hypoactive bowel sounds, soft, nontender.  Incisions:  clean, dry, and intact.  VAGINAL:  pad with minor spotting.  EXT:  ted hose on.  DPs 2+ bilaterally.   Assessment: s/p Procedure(s): TOTAL LAPAROSCOPIC HYSTERECTOMY WITH BILATERAL SALPINGECTOMY, FULGURATION OF ENDOMETRIOSIS (Bilateral) CYSTOSCOPY (N/A): progressing well  Plan: Discharge home Rx for Vicodin and Motrin.  Post op instructions reviewed in verbal and written form.  Surgical findings and procedure reviewed with patient.  FU in one week.   LOS: 0 days    Arloa Koh 04/09/2021, 5:31 PM

## 2021-04-10 ENCOUNTER — Encounter (HOSPITAL_BASED_OUTPATIENT_CLINIC_OR_DEPARTMENT_OTHER): Payer: Self-pay | Admitting: Obstetrics and Gynecology

## 2021-04-10 LAB — SURGICAL PATHOLOGY

## 2021-04-14 NOTE — Discharge Summary (Signed)
Physician Discharge Summary  Patient ID: Patty Garner MRN: [id number] DOB/AGE: 42-Feb-1982 42 y.o.  Admit date: 04/09/2021 Discharge date: 04/09/2021  Admission Diagnoses:  Dysmenorrhea  Fibroids  Discharge Diagnoses:   Dysmenorrhea  Fibroids  Minimal endometriosis 3.    Status post total laparoscopic hysterectomy with bilateral salpingectomy, fulguration of endometriosis, cystoscopy on 04/09/21. Active Problems:   Status post laparoscopic hysterectomy   Discharged Condition: good  Hospital Course:  The patient was admitted on 04/09/2021 for a total laparoscopic hysterectomy with bilateral salpingectomy, fulguration of endometriosis, cystoscopy which were performed without complication while under general anesthesia.  The patient's post op course was uneventful.  She received Toradol and Vicodin for pain control. She ambulated independently and wore PAS and Ted hose for DVT prophylaxis while in bed.  Her foley catheter were removed at the termination of surgery, and she was able to void spontaneously. The patient's vital signs remained stable and she demonstrated no signs of infection during her hospitalization.  The patient's post op day zero Hgb was 12.6.  She was tolerating the this well.  She had very minimal vaginal bleeding, and her incisions demonstrated no signs of erythema or significant drainage.  She was found to be in good condition and ready for discharge on post op day zero.  Consults: none  Significant Diagnostic Studies: See Hospital Course.  Treatments:  surgery:  total laparoscopic hysterectomy with bilateral salpingectomy, fulguration of endometriosis, cystoscopy on 04/09/21.  Discharge Exam: Blood pressure 111/74, pulse 74, temperature 98.2 F (36.8 C), resp. rate 18, height 5' 2.5" (1.588 m), weight 67.8 kg, last menstrual period 04/02/2021, SpO2 100 %. GEN:  alert, talkative. LUNGS:  CTA bilaterally.  COR:  S1S2 RRR.  ABDOMEN:  hypoactive bowel sounds, soft,  nontender.  Incisions:  clean, dry, and intact.  VAGINAL:  pad with minor spotting.  EXT:  ted hose on.  DPs 2+ bilaterally.   Disposition: Discharge disposition: 01-Home or Self Care     Discharge instructions were reviewed in verbal and written form.   Discharge Instructions     Discharge patient   Complete by: As directed    Discharge disposition: 01-Home or Self Care   Discharge patient date: 04/09/2021      Allergies as of 04/09/2021       Reactions   Statins    myalgias   Tylenol With Codeine #3 [acetaminophen-codeine] Nausea And Vomiting        Medication List     TAKE these medications    cetirizine 10 MG tablet Commonly known as: ZYRTEC Take 1 tablet (10 mg total) by mouth daily. What changed:  when to take this reasons to take this   Fish Oil 1000 MG Caps Take 1 capsule by mouth daily.   HYDROcodone-acetaminophen 5-325 MG tablet Commonly known as: NORCO/VICODIN Take 1-2 tablets by mouth every 4 (four) hours as needed for moderate pain.   ibuprofen 800 MG tablet Commonly known as: ADVIL Take 1 tablet (800 mg total) by mouth every 8 (eight) hours as needed for mild pain.   MAGNESIUM PO Take 1 tablet by mouth daily.   OVER THE COUNTER MEDICATION Athletic Greens Drinks once daily        Follow-up Information     Nunzio Cobbs, MD Follow up in 1 week(s).   Specialty: Obstetrics and Gynecology Contact information: [address] [place] [address] [phone]  Signed: Arloa Koh 04/14/2021, 2:24 PM

## 2021-04-16 NOTE — Progress Notes (Signed)
GYNECOLOGY  VISIT   HPI: 40 y.o.   Married  [place] American  female   Adrian with Patient's last menstrual period was 04/02/2021 (exact date).   here for 1 week status post TOTAL LAPAROSCOPIC HYSTERECTOMY WITH BILATERAL SALPINGECTOMY, FULGURATION OF ENDOMETRIOSIS (Bilateral)  CYSTOSCOPY.  Final pathology:  adenomyosis and fibroids.  Benign cervix and paratubal cysts.   Stopped Vicodin after two days.  Taking Ibuprofen daily.  Bleeding stopped after after post op day 3 or 4.  Taking Colace.  Eating normally.  Energy is good.    GYNECOLOGIC HISTORY: Patient's last menstrual period was 04/02/2021 (exact date). 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 06/02/2019   HLD (hyperlipidemia) 06/02/2016   Obesity 08/28/2015   Allergic rhinitis 10/26/2014   Contraceptive management 06/07/2014    Past Medical History:  Diagnosis Date   DJD (degenerative joint disease) 04/02/2021   LOWER BACK   Dysmenorrhea    Hypercholesteremia    STD (sexually transmitted disease)    chlamydia as teenager    Past Surgical History:  Procedure Laterality Date   CYSTOSCOPY N/A 04/09/2021   Procedure: CYSTOSCOPY;  Surgeon: Nunzio Cobbs, MD;  Location: [facility];  Service: Gynecology;  Laterality: N/A;   fractured ankle Left    20 YRS AGO, PLATE AND 7 SCREWS STILL IN PER PT ON 04-02-2021   MOUTH SURGERY  12/2020   METAL BAR  PUT IN TO PUT TEETH BACK IN PLACE   TOTAL LAPAROSCOPIC HYSTERECTOMY WITH SALPINGECTOMY Bilateral 04/09/2021   Procedure: TOTAL LAPAROSCOPIC HYSTERECTOMY WITH BILATERAL SALPINGECTOMY, FULGURATION OF ENDOMETRIOSIS;  Surgeon: Nunzio Cobbs, MD;  [facility]: [facility];  Service: Gynecology;  Laterality: Bilateral;    Current Outpatient Medications  Medication Sig Dispense Refill   cetirizine (ZYRTEC) 10 MG tablet Take 1 tablet (10 mg total) by mouth daily. (Patient taking differently: Take 10 mg by mouth as needed.) 30 tablet 11   ibuprofen (ADVIL) 800 MG tablet Take 1 tablet (800 mg total) by mouth every 8 (eight) hours as needed for mild pain. 30 tablet 0   MAGNESIUM PO Take 1 tablet by mouth daily.     Omega-3 Fatty Acids (FISH OIL) 1000 MG CAPS Take 1 capsule by mouth daily.     OVER THE COUNTER MEDICATION Athletic Greens Drinks once daily     No current facility-administered medications for this visit.     ALLERGIES: Statins and Tylenol with codeine #3 [acetaminophen-codeine]  Family History  Problem Relation Age of Onset   Diabetes Maternal Grandmother    Heart disease Maternal Grandmother    Hypertension Maternal Grandmother    Breast cancer Neg Hx  Social History   Socioeconomic History   Marital status: Married    Spouse name: Not on file   Number of children: Not on file   Years of education: Not on file   Highest education level: Not on file  Occupational History   Not on file  Tobacco Use   Smoking status: Former   Smokeless tobacco: Never   Tobacco comments:    SOCIAL SMOKER FOR A FEW YEARS  Vaping Use   Vaping Use: Never used  Substance and Sexual Activity   Alcohol use: Not Currently   Drug use: No   Sexual activity: Yes    Birth control/protection: Other-see comments    Comment: Female partner  Other Topics Concern   Not on file  Social History Narrative   Not on file   Social Determinants of Health   Financial Resource  Strain: Not on file  Food Insecurity: Not on file  Transportation Needs: Not on file  Physical Activity: Not on file  Stress: Not on file  Social Connections: Not on file  Intimate Partner Violence: Not on file    Review of Systems  All other systems reviewed and are negative.  PHYSICAL EXAMINATION:    BP 108/62   Pulse 67   Ht 5' 2.5" (1.588 m)   LMP 04/02/2021 (Exact Date)   SpO2 100%   BMI 26.91 kg/m     General appearance: alert, cooperative and appears stated age   Abdomen: Incisions intact.  Abdomen is soft, non-tender, no masses,  no organomegaly  ASSESSMENT  Status post laparoscopic hysterectomy with bilateral salpingectomy, fulguration of endometriosis, cystoscopy.  Doing well post op.  PLAN  Surgical findings, procedure, and pathology report reviewed.  Ok to increase activity and start doing some walking.  Ok to drive.  Nothing per vagina.  FU for 6 week post op visit.    An After Visit Summary was printed and given to the patient.

## 2021-04-18 ENCOUNTER — Other Ambulatory Visit: Payer: Self-pay

## 2021-04-18 ENCOUNTER — Encounter: Payer: Self-pay | Admitting: Obstetrics and Gynecology

## 2021-04-18 ENCOUNTER — Ambulatory Visit (INDEPENDENT_AMBULATORY_CARE_PROVIDER_SITE_OTHER): Payer: BC Managed Care – PPO | Admitting: Obstetrics and Gynecology

## 2021-04-18 VITALS — BP 108/62 | HR 67 | Ht 62.5 in

## 2021-04-18 DIAGNOSIS — Z90710 Acquired absence of both cervix and uterus: Secondary | ICD-10-CM

## 2021-04-19 ENCOUNTER — Encounter: Payer: Self-pay | Admitting: Obstetrics and Gynecology

## 2021-04-28 ENCOUNTER — Encounter: Payer: Self-pay | Admitting: Obstetrics and Gynecology

## 2021-04-29 NOTE — Telephone Encounter (Signed)
Dr.Silva do you want patient to be seen? 6 week post op scheduled on 05/20/21

## 2021-04-29 NOTE — Telephone Encounter (Signed)
I do recommend an office visit with me.   Thanks!

## 2021-05-02 ENCOUNTER — Ambulatory Visit: Payer: BC Managed Care – PPO | Admitting: Obstetrics and Gynecology

## 2021-05-02 ENCOUNTER — Other Ambulatory Visit: Payer: Self-pay

## 2021-05-02 ENCOUNTER — Encounter: Payer: Self-pay | Admitting: Obstetrics and Gynecology

## 2021-05-02 VITALS — BP 110/70

## 2021-05-02 DIAGNOSIS — Z90710 Acquired absence of both cervix and uterus: Secondary | ICD-10-CM

## 2021-05-02 DIAGNOSIS — N939 Abnormal uterine and vaginal bleeding, unspecified: Secondary | ICD-10-CM

## 2021-05-02 NOTE — Patient Instructions (Signed)
Reduce your activity level and do more sitting.   Please call if the bleeding recurs.

## 2021-05-02 NOTE — Progress Notes (Signed)
GYNECOLOGY  VISIT   HPI: 40 y.o.   Married  [place] American  female   Patty Garner with Patient's last menstrual period was 04/02/2021 (exact date).   here for 3 weeks status post TOTAL LAPAROSCOPIC HYSTERECTOMY WITH BILATERAL SALPINGECTOMY, FULGURATION OF ENDOMETRIOSIS (Bilateral)  CYSTOSCOPY.  Patient had some heavy vaginal bleeding 04-28-21. Bleeding has completely stopped now. No pain.   Standing a lot.  Has a stand up desk and is working from home.  Not exercising.  Not placing anything per vaginal.   No fevers or flu like symptoms.   GYNECOLOGIC HISTORY: Patient's last menstrual period was 04/02/2021 (exact date). 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   HLD (hyperlipidemia) 06/02/2016   Obesity 08/28/2015   Allergic rhinitis 10/26/2014   Contraceptive management 06/07/2014    Past Medical History:  Diagnosis Date   DJD (degenerative joint disease) 04/02/2021   LOWER BACK   Dysmenorrhea    Hypercholesteremia    STD (sexually transmitted disease)    chlamydia as teenager    Past Surgical History:  Procedure Laterality Date   CYSTOSCOPY N/A 04/09/2021   Procedure: CYSTOSCOPY;  Surgeon: Nunzio Cobbs, MD;  Location[facility];  Service: Gynecology;  Laterality: N/A;   fractured ankle Left    20 YRS AGO, PLATE AND 7 SCREWS STILL IN PER PT ON 04-02-2021   MOUTH SURGERY  12/2020   METAL BAR PUT  IN TO PUT TEETH BACK IN PLACE   TOTAL LAPAROSCOPIC HYSTERECTOMY WITH SALPINGECTOMY Bilateral 04/09/2021   Procedure: TOTAL LAPAROSCOPIC HYSTERECTOMY WITH BILATERAL SALPINGECTOMY, FULGURATION OF ENDOMETRIOSIS;  Surgeon: Nunzio Cobbs, MD;  Location: [facility];  Service: Gynecology;  Laterality: Bilateral;    Current Outpatient Medications  Medication Sig Dispense Refill   cetirizine (ZYRTEC) 10 MG tablet Take 1 tablet (10 mg total) by mouth daily. (Patient taking differently: Take 10 mg by mouth as needed.) 30 tablet 11   MAGNESIUM PO Take 1 tablet by mouth daily.     Omega-3 Fatty Acids (FISH OIL) 1000 MG CAPS Take 1 capsule by mouth daily.     OVER THE COUNTER MEDICATION Athletic Greens Drinks once daily     No current facility-administered medications for this visit.     ALLERGIES: Statins and Tylenol with codeine #3 [acetaminophen-codeine]  Family History  Problem Relation Age of Onset   Diabetes Maternal Grandmother    Heart disease Maternal Grandmother    Hypertension Maternal Grandmother    Breast cancer Neg Hx     Social History   Socioeconomic History   Marital status: Married    Spouse name: Not on file   Number of children:  Not on file   Years of education: Not on file   Highest education level: Not on file  Occupational History   Not on file  Tobacco Use   Smoking status: Former   Smokeless tobacco: Never   Tobacco comments:    SOCIAL SMOKER FOR A FEW YEARS  Vaping Use   Vaping Use: Never used  Substance and Sexual Activity   Alcohol use: Not Currently   Drug use: No   Sexual activity: Yes    Birth control/protection: Other-see comments    Comment: Female partner  Other Topics Concern   Not on file  Social History Narrative   Not on file   Social Determinants of Health   Financial Resource Strain: Not on file  Food Insecurity: Not on file  Transportation Needs: Not on file  Physical Activity: Not on file  Stress: Not  on file  Social Connections: Not on file  Intimate Partner Violence: Not on file    Review of Systems  All other systems reviewed and are negative.  PHYSICAL EXAMINATION:    BP 110/70   LMP 04/02/2021 (Exact Date)     General appearance: alert, cooperative and appears stated age   Pelvic: External genitalia:  no lesions              Urethra:  normal appearing urethra with no masses, tenderness or lesions              Bartholins and Skenes: normal                 Vagina: normal appearing vagina with normal color and discharge, no lesions              Cervix: absent.  Vaginal cuff intact.  Midline vaginal cuff incision with raw edge of mucosa, bleeding slightly.  Treated with silver nitrate.  Good hemostasis.                 Bimanual Exam:  Uterus:  absent              Adnexa: no mass, fullness, tenderness         Chaperone was present for exam:  Estill Bamberg, CMA  ASSESSMENT  Vaginal cuff bleeding status post laparoscopic hysterectomy.  No evidence of dehiscence or vaginal cuff hematoma.   PLAN  Decreased activity and reduce standing.  Call for recurrent vaginal bleeding.  Nothing per vagina.  Keep 6 week post op visit.    An After Visit Summary was printed and given to the patient.

## 2021-05-20 ENCOUNTER — Ambulatory Visit (INDEPENDENT_AMBULATORY_CARE_PROVIDER_SITE_OTHER): Payer: BC Managed Care – PPO | Admitting: Obstetrics and Gynecology

## 2021-05-20 ENCOUNTER — Other Ambulatory Visit: Payer: Self-pay

## 2021-05-20 ENCOUNTER — Encounter: Payer: Self-pay | Admitting: Obstetrics and Gynecology

## 2021-05-20 VITALS — BP 110/64 | Ht 62.5 in | Wt 159.0 lb

## 2021-05-20 DIAGNOSIS — Z90710 Acquired absence of both cervix and uterus: Secondary | ICD-10-CM

## 2021-05-20 NOTE — Progress Notes (Signed)
GYNECOLOGY  VISIT   HPI: 40 y.o.   Married  [place] American  female   [place] with Patient's last menstrual period was 04/02/2021 (exact date).   here for 6 weeks status post TOTAL LAPAROSCOPIC HYSTERECTOMY WITH BILATERAL SALPINGECTOMY, FULGURATION OF ENDOMETRIOSIS (Bilateral)  CYSTOSCOPY Procedures.  Little bit of pink spotting today.  No pain.   Good energy.  Feels great.   GYNECOLOGIC HISTORY: Patient's last menstrual period was 04/02/2021 (exact date). 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   HLD (hyperlipidemia) 06/02/2016   Obesity 08/28/2015   Allergic rhinitis 10/26/2014   Contraceptive management 06/07/2014    Past Medical History:  Diagnosis Date   DJD (degenerative joint disease) 04/02/2021   LOWER BACK   Dysmenorrhea    Hypercholesteremia    STD (sexually transmitted disease)    chlamydia as teenager    Past Surgical History:  Procedure Laterality Date   CYSTOSCOPY N/A 04/09/2021   Procedure: CYSTOSCOPY;  Surgeon: Nunzio Cobbs, MD;  Location: [facility];  Service: Gynecology;  Laterality: N/A;   fractured ankle Left    20 YRS AGO, PLATE AND 7 SCREWS STILL IN PER PT ON 04-02-2021   MOUTH SURGERY  12/2020   METAL BAR PUT IN TO PUT TEETH BACK IN PLACE   TOTAL LAPAROSCOPIC HYSTERECTOMY WITH SALPINGECTOMY Bilateral 04/09/2021   Procedure: TOTAL LAPAROSCOPIC HYSTERECTOMY WITH  BILATERAL SALPINGECTOMY, FULGURATION OF ENDOMETRIOSIS;  Surgeon: Nunzio Cobbs, MD;  Location: [facility];  Service: Gynecology;  Laterality: Bilateral;    Current Outpatient Medications  Medication Sig Dispense Refill   cetirizine (ZYRTEC) 10 MG tablet Take 1 tablet (10 mg total) by mouth daily. (Patient taking differently: Take 10 mg by mouth as needed.) 30 tablet 11   MAGNESIUM PO Take 1 tablet by mouth daily.     Omega-3 Fatty Acids (FISH OIL) 1000 MG CAPS Take 1 capsule by mouth daily.     OVER THE COUNTER MEDICATION Athletic [employer] Drinks once daily     No current facility-administered medications for this visit.     ALLERGIES: Statins and Tylenol with codeine #3 [acetaminophen-codeine]  Family History  Problem Relation Age of Onset   Diabetes Maternal Grandmother    Heart disease Maternal Grandmother    Hypertension Maternal Grandmother    Breast cancer Neg Hx     Social History   Socioeconomic History   Marital status: Married    Spouse name: Not on file   Number of children: Not on file   Years of education: Not on file   Highest education level: Not on file  Occupational History  Not on file  Tobacco Use   Smoking status: Former   Smokeless tobacco: Never   Tobacco comments:    SOCIAL SMOKER FOR A FEW YEARS  Vaping Use   Vaping Use: Never used  Substance and Sexual Activity   Alcohol use: Not Currently   Drug use: No   Sexual activity: Yes    Birth control/protection: Other-see comments    Comment: Female partner  Other Topics Concern   Not on file  Social History Narrative   Not on file   Social Determinants of Health   Financial Resource Strain: Not on file  Food Insecurity: Not on file  Transportation Needs: Not on file  Physical Activity: Not on file  Stress: Not on file  Social Connections: Not on file  Intimate Partner Violence: Not on file    Review of Systems  All other systems reviewed and are  negative.  PHYSICAL EXAMINATION:    BP 110/64   Ht 5' 2.5" (1.588 m)   Wt 159 lb (72.1 kg)   LMP 04/02/2021 (Exact Date)   BMI 28.62 kg/m     General appearance: alert, cooperative and appears stated age  Abdomen: Incisions intact.  Abdomen is soft, non-tender, no masses,  no organomegaly   Pelvic: External genitalia:  no lesions              Urethra:  normal appearing urethra with no masses, tenderness or lesions              Bartholins and Skenes: normal                 Vagina: normal appearing vagina with normal color and discharge, no lesions              Cervix: absent.  Small amount of granulation tissue in midline of incision.  Sutures are still present.                Bimanual Exam:  Uterus:  absent              Adnexa: no mass, fullness, tenderness       Chaperone was present for exam:  Onalee Hua, CMA.  ASSESSMENT  Status post total laparoscopic hysterectomy.  Doing well overall.   PLAN  Can increase walking.  No heavy lifting or strenuous physical activity.  Fu for next post op visit in 6 weeks.   If granulation tissue is present then, will treat with silver nitrate.    An After Visit Summary was printed and given to the patient.

## 2021-06-19 ENCOUNTER — Other Ambulatory Visit: Payer: Self-pay

## 2021-06-19 ENCOUNTER — Ambulatory Visit (INDEPENDENT_AMBULATORY_CARE_PROVIDER_SITE_OTHER): Payer: BC Managed Care – PPO | Admitting: Nurse Practitioner

## 2021-06-19 ENCOUNTER — Encounter: Payer: Self-pay | Admitting: Nurse Practitioner

## 2021-06-19 VITALS — BP 110/70 | HR 80 | Temp 96.5°F | Ht 62.5 in | Wt 149.0 lb

## 2021-06-19 DIAGNOSIS — I951 Orthostatic hypotension: Secondary | ICD-10-CM

## 2021-06-19 DIAGNOSIS — R739 Hyperglycemia, unspecified: Secondary | ICD-10-CM | POA: Diagnosis not present

## 2021-06-19 DIAGNOSIS — Z13220 Encounter for screening for lipoid disorders: Secondary | ICD-10-CM | POA: Diagnosis not present

## 2021-06-19 DIAGNOSIS — E7800 Pure hypercholesterolemia, unspecified: Secondary | ICD-10-CM

## 2021-06-19 DIAGNOSIS — Z136 Encounter for screening for cardiovascular disorders: Secondary | ICD-10-CM

## 2021-06-19 DIAGNOSIS — Z0000 Encounter for general adult medical examination without abnormal findings: Secondary | ICD-10-CM

## 2021-06-19 LAB — CBC WITH DIFFERENTIAL/PLATELET
Basophils Absolute: 0 10*3/uL (ref 0.0–0.1)
Basophils Relative: 0.7 % (ref 0.0–3.0)
Eosinophils Absolute: 0.1 10*3/uL (ref 0.0–0.7)
Eosinophils Relative: 2.4 % (ref 0.0–5.0)
HCT: 41.4 % (ref 36.0–46.0)
Hemoglobin: 13.7 g/dL (ref 12.0–15.0)
Lymphocytes Relative: 32.5 % (ref 12.0–46.0)
Lymphs Abs: 1.4 10*3/uL (ref 0.7–4.0)
MCHC: 33 g/dL (ref 30.0–36.0)
MCV: 89.4 fl (ref 78.0–100.0)
Monocytes Absolute: 0.3 10*3/uL (ref 0.1–1.0)
Monocytes Relative: 8.1 % (ref 3.0–12.0)
Neutro Abs: 2.4 10*3/uL (ref 1.4–7.7)
Neutrophils Relative %: 56.3 % (ref 43.0–77.0)
Platelets: 250 10*3/uL (ref 150.0–400.0)
RBC: 4.63 Mil/uL (ref 3.87–5.11)
RDW: 13.7 % (ref 11.5–15.5)
WBC: 4.2 10*3/uL (ref 4.0–10.5)

## 2021-06-19 LAB — LIPID PANEL
Cholesterol: 272 mg/dL — ABNORMAL HIGH (ref 0–200)
HDL: 77.6 mg/dL (ref >39.00)
LDL Cholesterol: 185 mg/dL — ABNORMAL HIGH (ref 0–99)
NonHDL: 193.9
Total CHOL/HDL Ratio: 3
Triglycerides: 43 mg/dL (ref 0.0–149.0)
VLDL: 8.6 mg/dL (ref 0.0–40.0)

## 2021-06-19 LAB — TSH: TSH: 0.83 u[IU]/mL (ref 0.35–5.50)

## 2021-06-19 LAB — COMPREHENSIVE METABOLIC PANEL
ALT: 18 U/L (ref 0–35)
AST: 18 U/L (ref 0–37)
Albumin: 4.6 g/dL (ref 3.5–5.2)
Alkaline Phosphatase: 45 U/L (ref 39–117)
BUN: 10 mg/dL (ref 6–23)
CO2: 25 mEq/L (ref 19–32)
Calcium: 9.6 mg/dL (ref 8.4–10.5)
Chloride: 104 mEq/L (ref 96–112)
Creatinine, Ser: 0.62 mg/dL (ref 0.40–1.20)
GFR: 109.49 mL/min (ref >60.00)
Glucose, Bld: 87 mg/dL (ref 70–99)
Potassium: 4.2 mEq/L (ref 3.5–5.1)
Sodium: 137 mEq/L (ref 135–145)
Total Bilirubin: 0.9 mg/dL (ref 0.2–1.2)
Total Protein: 7.5 g/dL (ref 6.0–8.3)

## 2021-06-19 LAB — HEMOGLOBIN A1C: Hgb A1c MFr Bld: 5.7 % (ref 4.6–6.5)

## 2021-06-19 NOTE — Patient Instructions (Signed)
Go to lab for blood draw.  Orthostatic Hypotension Blood pressure is a measurement of how strongly, or weakly, your circulating blood is pressing against the walls of your arteries. Orthostatic hypotension is a drop in blood pressure that can happen when you change positions, such as when you go from lying down to standing. Arteries are blood vessels that carry blood from your heart throughout your body. When blood pressure is too low, you may not get enough blood to your brain or to the rest of your organs. Orthostatic hypotension can cause light-headedness, sweating, rapid heartbeat, blurred vision, and fainting. These symptoms require further investigation into the cause. What are the causes? Orthostatic hypotension can be caused by many things, including: Sudden changes in posture, such as standing up quickly after you have been sitting or lying down. Loss of blood (anemia) or loss of body fluids (dehydration). Heart problems, neurologic problems, or hormone problems. Pregnancy. Aging. The risk for this condition increases as you get older. Severe infection (sepsis). Certain medicines, such as medicines for high blood pressure or medicines that make the body lose excess fluids (diuretics). What are the signs or symptoms? Symptoms of this condition may include: Weakness, light-headedness, or dizziness. Sweating. Blurred vision. Tiredness (fatigue). Rapid heartbeat. Fainting, in severe cases. How is this diagnosed? This condition is diagnosed based on: Your symptoms and medical history. Your blood pressure measurements. Your health care provider will check your blood pressure when you are: Lying down. Sitting. Standing. A blood pressure reading is recorded as two numbers, such as "120 over 80" (or 120/80). The first ("top") number is called the systolic pressure. It is a measure of the pressure in your arteries as your heart beats. The second ("bottom") number is called the diastolic  pressure. It is a measure of the pressure in your arteries when your heart relaxes between beats. Blood pressure is measured in a unit called mmHg. Healthy blood pressure for most adults is 120/80 mmHg. Orthostatic hypotension is defined as a 20 mmHg drop in systolic pressure or a 10 mmHg drop in diastolic pressure within 3 minutes of standing. Other information or tests that may be used to diagnose orthostatic hypotension include: Your other vital signs, such as your heart rate and temperature. Blood tests. An electrocardiogram (ECG) or echocardiogram. A Holter monitor. This is a device you wear that records your heart rhythm continuously, usually for 24-48 hours. Tilt table test. For this test, you will be safely secured to a table that moves you from a lying position to an upright position. Your heart rhythm and blood pressure will be monitored during the test. How is this treated? This condition may be treated by: Changing your diet. This may involve eating more salt (sodium) or drinking more water. Changing the dosage of certain medicines you are taking that might be lowering your blood pressure. Correcting the underlying reason for the orthostatic hypotension. Wearing compression stockings. Taking medicines to raise your blood pressure. Avoiding actions that trigger symptoms. Follow these instructions at home: Medicines Take over-the-counter and prescription medicines only as told by your health care provider. Follow instructions from your health care provider about changing the dosage of your current medicines, if this applies. Do not stop or adjust any of your medicines on your own. Eating and drinking  Drink enough fluid to keep your urine pale yellow. Eat extra salt only as directed. Do not add extra salt to your diet unless advised by your health care provider. Eat frequent, small meals. Avoid standing  up suddenly after eating. General instructions  Get up slowly from lying  down or sitting positions. This gives your blood pressure a chance to adjust. Avoid hot showers and excessive heat as directed by your health care provider. Engage in regular physical activity as directed by your health care provider. If you have compression stockings, wear them as told. Keep all follow-up visits. This is important. Contact a health care provider if: You have a fever for more than 2-3 days. You feel more thirsty than usual. You feel dizzy or weak. Get help right away if: You have chest pain. You have a fast or irregular heartbeat. You become sweaty or feel light-headed. You feel short of breath. You faint. You have any symptoms of a stroke. "BE FAST" is an easy way to remember the main warning signs of a stroke: B - Balance. Signs are dizziness, sudden trouble walking, or loss of balance. E - Eyes. Signs are trouble seeing or a sudden change in vision. F - Face. Signs are sudden weakness or numbness of the face, or the face or eyelid drooping on one side. A - Arms. Signs are weakness or numbness in an arm. This happens suddenly and usually on one side of the body. S - Speech. Signs are sudden trouble speaking, slurred speech, or trouble understanding what people say. T - Time. Time to call emergency services. Write down what time symptoms started. You have other signs of a stroke, such as: A sudden, severe headache with no known cause. Nausea or vomiting. Seizure. These symptoms may represent a serious problem that is an emergency. Do not wait to see if the symptoms will go away. Get medical help right away. Call your local emergency services (911 in the [place] Do not drive yourself to the hospital. Summary Orthostatic hypotension is a sudden drop in blood pressure. It can cause light-headedness, sweating, rapid heartbeat, blurred vision, and fainting. Orthostatic hypotension can be diagnosed by having your blood pressure taken while lying down, sitting, and then  standing. Treatment may involve changing your diet, wearing compression stockings, sitting up slowly, adjusting your medicines, or correcting the underlying reason for the orthostatic hypotension. Get help right away if you have chest pain, a fast or irregular heartbeat, or symptoms of a stroke. This information is not intended to replace advice given to you by your health care provider. Make sure you discuss any questions you have with your health care provider. Document Revised: 09/20/2020 Document Reviewed: 09/20/2020 Elsevier Patient Education  [place].

## 2021-06-19 NOTE — Assessment & Plan Note (Signed)
Chronic, intermittent Lightheadedness with rapid change in position (sitting to standing), improving since hysterectomy. 1 syncopal episode in June 2022. She is aware to change position slowly and maintain adequate oral hydration.

## 2021-06-19 NOTE — Progress Notes (Signed)
Subjective:    Patient ID: Patty Garner, female    DOB: 1981/03/13, 40 y.o.   MRN: [id number]  Patient presents today for CPE   HPI  Orthostatic hypotension Chronic, intermittent Lightheadedness with rapid change in position (sitting to standing), improving since hysterectomy. 1 syncopal episode in June 2022. She is aware to change position slowly and maintain adequate oral hydration.   Vision:up to date Dental:up to date Diet:regular Exercise:walking and weight training daily Weight:  Wt Readings from Last 3 Encounters:  06/19/21 149 lb (67.6 kg)  05/20/21 159 lb (72.1 kg)  04/09/21 149 lb 8 oz (67.8 kg)    Sexual History (orientation,birth control, marital status, STD):s/p hysterectomy, benign surgical pathology, up to date with mammogram, no need for STD screen  Depression/Suicide: Depression screen [facility] 2/9 06/19/2021 06/13/2020 02/29/2020 06/24/2018 03/13/2017 01/30/2017 12/04/2016  Decreased Interest 0 0 0 0 0 0 0  Down, Depressed, Hopeless 0 0 0 0 0 0 0  PHQ - 2 Score 0 0 0 0 0 0 0   Immunizations: (TDAP, Hep C screen, Pneumovax, Influenza, zoster)  Health Maintenance  Topic Date Due   Flu Shot  10/18/2021*   Hepatitis C Screening: USPSTF Recommendation to screen - Ages 18-79 yo.  06/19/2022*   Pap Smear  06/14/2023   Tetanus Vaccine  08/26/2027   HIV Screening  Completed   Pneumococcal Vaccination  Aged Out   HPV Vaccine  Aged Out   COVID-19 Vaccine  Discontinued  *Topic was postponed. The date shown is not the original due date.   Fall Risk: Fall Risk  06/19/2021 06/13/2020 02/29/2020 03/09/2019 06/24/2018 03/13/2017 01/30/2017  Falls in the past year? 1 0 0 0 0 No No  Number falls in past yr: 0 0 0 - - - -  Injury with Fall? 0 0 0 - - - -  Risk for fall due to : History of fall(s) - - - - - -  Follow up Falls evaluation completed - - Falls evaluation completed - - -   Advanced Directive: Advanced Directives 04/09/2021  Does Patient Have a Medical Advance  Directive? No  Would patient like information on creating a medical advance directive? No - Patient declined    Medications and allergies reviewed with patient and updated if appropriate.  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 (Patient taking differently: Take 10 mg by mouth as needed.) 30 tablet 11   MAGNESIUM PO Take 1 tablet by mouth daily.     Omega-3 Fatty Acids (FISH OIL) 1000 MG CAPS Take 1 capsule by mouth daily.     OVER THE COUNTER MEDICATION [employer] Drinks once daily     No current facility-administered medications on file prior to visit.    Past Medical History:  Diagnosis Date   Allergy 03/19/1993   DJD (degenerative joint disease) 04/02/2021   LOWER BACK   Dysmenorrhea    Hypercholesteremia    STD (sexually transmitted disease)    chlamydia as teenager    Past  Surgical History:  Procedure Laterality Date   ABDOMINAL HYSTERECTOMY  04/09/2021   CYSTOSCOPY N/A 04/09/2021   Procedure: CYSTOSCOPY;  Surgeon: Nunzio Cobbs, MD;  Location: [facility];  Service: Gynecology;  Laterality: N/A;   FRACTURE SURGERY  2004   Broke ankle   fractured ankle Left    20 YRS AGO, PLATE AND 7 SCREWS STILL IN PER PT ON 04-02-2021   MOUTH SURGERY  12/2020   METAL BAR PUT IN TO PUT TEETH BACK IN PLACE   TOTAL LAPAROSCOPIC HYSTERECTOMY WITH SALPINGECTOMY Bilateral 04/09/2021   Procedure: TOTAL LAPAROSCOPIC HYSTERECTOMY WITH BILATERAL  SALPINGECTOMY, FULGURATION OF ENDOMETRIOSIS;  Surgeon: Nunzio Cobbs, MD;  Location: [facility];  Service: Gynecology;  Laterality: Bilateral;    Social History   Socioeconomic History   Marital status: Married    Spouse name: Not on file   Number of children: Not on file   Years of education: Not on file   Highest education level: Not on file  Occupational History   Not on file  Tobacco Use   Smoking status: Former    Types: Cigarettes    Quit date: 07/21/2004    Years since quitting: 16.9   Smokeless tobacco: Never   Tobacco comments:    Social smoker. Never bought packs  Vaping Use   Vaping Use: Never used  Substance and Sexual Activity   Alcohol use: Not Currently   Drug use: No   Sexual activity: Yes    Birth control/protection: None    Comment: Female partner  Other Topics Concern   Not on file  Social History Narrative   Not on file   Social Determinants of Health   Financial Resource Strain: Not on file  Food Insecurity: Not on file  Transportation Needs: Not on file  Physical Activity: Not on file  Stress: Not on file  Social Connections: Not on file    Family History  Problem Relation Age of Onset   Diabetes Maternal Grandmother    Heart disease Maternal Grandmother    Hypertension Maternal Grandmother    Breast cancer Neg Hx         Review of Systems  Constitutional:  Negative for fever, malaise/fatigue and weight loss.  HENT:  Negative for congestion and sore throat.   Eyes:        Negative for visual changes  Respiratory:  Negative for cough and shortness of breath.   Cardiovascular:  Negative for chest pain, palpitations and leg swelling.  Gastrointestinal:  Negative for blood in stool, constipation, diarrhea and heartburn.  Genitourinary:  Negative for dysuria, frequency and urgency.  Musculoskeletal:  Negative for falls, joint pain and myalgias.  Skin:  Negative for rash.  Neurological:  Negative for dizziness,  sensory change and headaches.  Endo/Heme/Allergies:  Does not bruise/bleed easily.  Psychiatric/Behavioral:  Negative for depression, substance abuse and suicidal ideas. The patient is not nervous/anxious.    Objective:   Vitals:   06/19/21 0834  BP: 110/70  Pulse: 80  Temp: (!) 96.5 F (35.8 C)  SpO2: 98%   Body mass index is 26.82 kg/m.  Physical Examination:  Physical Exam Vitals reviewed.  Constitutional:      General: She is not in acute distress.    Appearance: She is well-developed.  HENT:     Right Ear: Tympanic membrane, ear canal and external ear normal.     Left Ear: Tympanic membrane, ear canal and external ear normal.  Eyes:     Extraocular Movements: Extraocular movements intact.     Conjunctiva/sclera: Conjunctivae normal.  Cardiovascular:     Rate and Rhythm: Normal rate and regular rhythm.     Pulses: Normal pulses.     Heart sounds: Normal heart sounds.  Pulmonary:     Effort: Pulmonary effort is normal. No respiratory distress.     Breath sounds: Normal breath sounds.  Chest:     Chest wall: No tenderness.  Abdominal:     General: Bowel sounds are normal.     Palpations: Abdomen is soft.  Genitourinary:    Comments: Deferred breast and pelvic exam to GYN Musculoskeletal:        General: Normal range of motion.     Cervical back: Normal range of motion and neck supple.     Right lower leg: No edema.     Left lower leg: No edema.  Lymphadenopathy:     Cervical: No cervical adenopathy.  Skin:    General: Skin is warm and dry.  Neurological:     Mental Status: She is alert and oriented to person, place, and time.     Deep Tendon Reflexes: Reflexes are normal and symmetric.  Psychiatric:        Mood and Affect: Mood normal.        Behavior: Behavior normal.        Thought Content: Thought content normal.    ASSESSMENT and PLAN: This visit occurred during the SARS-CoV-2 public health emergency.  Safety protocols were in place, including  screening questions prior to the visit, additional usage of staff PPE, and extensive cleaning of exam room while observing appropriate contact time as indicated for disinfecting solutions.   Yesli was seen today for annual exam.  Diagnoses and all orders for this visit:  Preventative health care -     Lipid panel -     CBC with Differential/Platelet -     TSH -     Comprehensive metabolic panel  Orthostatic hypotension  Encounter for lipid screening for cardiovascular disease -     Lipid panel  Hyperglycemia -     Hemoglobin A1c    Continue daily exercise and heart healthy diet. Call office if orthostatic symptoms worsen.  Problem List Items Addressed This Visit       Cardiovascular and Mediastinum   Orthostatic hypotension   Other Visit Diagnoses     Preventative health care    -  Primary   Relevant Orders   Lipid panel   CBC with Differential/Platelet   TSH   Comprehensive metabolic panel   Encounter for lipid screening for cardiovascular disease       Relevant Orders   Lipid panel   Hyperglycemia       Relevant Orders   Hemoglobin A1c       Follow up: Return in about 1 year (around 06/19/2022) for CPE (fasting).  Wilfred Lacy, NP

## 2021-06-20 NOTE — Addendum Note (Signed)
Addended by: Leana Gamer on: 06/20/2021 03:42 PM   Modules accepted: Orders

## 2021-07-03 ENCOUNTER — Ambulatory Visit (INDEPENDENT_AMBULATORY_CARE_PROVIDER_SITE_OTHER): Payer: BC Managed Care – PPO | Admitting: Obstetrics and Gynecology

## 2021-07-03 ENCOUNTER — Other Ambulatory Visit: Payer: Self-pay

## 2021-07-03 VITALS — BP 112/64 | HR 84 | Ht 62.0 in | Wt 159.0 lb

## 2021-07-03 DIAGNOSIS — Z90710 Acquired absence of both cervix and uterus: Secondary | ICD-10-CM

## 2021-07-03 DIAGNOSIS — N898 Other specified noninflammatory disorders of vagina: Secondary | ICD-10-CM

## 2021-07-03 NOTE — Progress Notes (Signed)
GYNECOLOGY  VISIT   HPI: 40 y.o.   Married  [place] American  female   [place] with Patient's last menstrual period was 04/02/2021 (exact date).   here for 12 weeks status post TOTAL LAPAROSCOPIC HYSTERECTOMY WITH BILATERAL SALPINGECTOMY, FULGURATION OF ENDOMETRIOSIS (Bilateral) CYSTOSCOPY Procedures.    No vaginal bleeding.  No pain.   Feels really good.  GYNECOLOGIC HISTORY: Patient's last menstrual period was 04/02/2021 (exact date). 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   HLD (hyperlipidemia) 06/02/2016   Obesity 08/28/2015   Allergic rhinitis 10/26/2014   Contraceptive management 06/07/2014    Past Medical History:  Diagnosis Date   Allergy 03/19/1993   DJD (degenerative joint disease) 04/02/2021   LOWER BACK   Dysmenorrhea    Hypercholesteremia    STD (sexually transmitted disease)    chlamydia as teenager    Past Surgical History:  Procedure Laterality Date   ABDOMINAL HYSTERECTOMY  04/09/2021   CYSTOSCOPY N/A 04/09/2021   Procedure: CYSTOSCOPY;  Surgeon: Nunzio Cobbs, MD;  Location: [facility];  Service: Gynecology;  Laterality: N/A;   FRACTURE SURGERY  2004   Broke ankle   fractured ankle Left    20 YRS AGO, PLATE AND 7 SCREWS STILL IN PER PT ON 04-02-2021   MOUTH SURGERY  12/2020   METAL BAR PUT IN TO PUT TEETH BACK IN PLACE   TOTAL  LAPAROSCOPIC HYSTERECTOMY WITH SALPINGECTOMY Bilateral 04/09/2021   Procedure: TOTAL LAPAROSCOPIC HYSTERECTOMY WITH BILATERAL SALPINGECTOMY, FULGURATION OF ENDOMETRIOSIS;  Surgeon: Nunzio Cobbs, MD;  Location: [facility];  Service: Gynecology;  Laterality: Bilateral;    Current Outpatient Medications  Medication Sig Dispense Refill   cetirizine (ZYRTEC) 10 MG tablet Take 1 tablet (10 mg total) by mouth daily. (Patient taking differently: Take 10 mg by mouth as needed.) 30 tablet 11   MAGNESIUM PO Take 1 tablet by mouth daily.     Omega-3 Fatty Acids (FISH OIL) 1000 MG CAPS Take 1 capsule by mouth daily.     OVER THE COUNTER MEDICATION Athletic [employer] Drinks once daily     No current facility-administered medications for this visit.     ALLERGIES: Statins and Tylenol with codeine #3 [acetaminophen-codeine]  Family History  Problem Relation Age of Onset   Diabetes Maternal Grandmother    Heart disease Maternal Grandmother    Hypertension Maternal Grandmother    Breast cancer Neg Hx     Social History   Socioeconomic History   Marital status: Married    Spouse name: Not on file   Number of children: Not on file   Years of education: Not  on file   Highest education level: Not on file  Occupational History   Not on file  Tobacco Use   Smoking status: Former    Types: Cigarettes    Quit date: 07/21/2004    Years since quitting: 16.9   Smokeless tobacco: Never   Tobacco comments:    Social smoker. Never bought packs  Vaping Use   Vaping Use: Never used  Substance and Sexual Activity   Alcohol use: Not Currently   Drug use: No   Sexual activity: Yes    Birth control/protection: None    Comment: Female partner  Other Topics Concern   Not on file  Social History Narrative   Not on file   Social Determinants of Health   Financial Resource Strain: Not on file  Food Insecurity: Not on file  Transportation Needs: Not on file  Physical  Activity: Not on file  Stress: Not on file  Social Connections: Not on file  Intimate Partner Violence: Not on file    Review of Systems  All other systems reviewed and are negative.  PHYSICAL EXAMINATION:    BP 112/64    Pulse 84    Ht 5\' 2"  (1.575 m)    Wt 159 lb (72.1 kg)    LMP 04/02/2021 (Exact Date)    SpO2 100%    BMI 29.08 kg/m     General appearance: alert, cooperative and appears stated age  Pelvic: External genitalia:  no lesions              Urethra:  normal appearing urethra with no masses, tenderness or lesions              Bartholins and Skenes: normal                 Vagina: normal appearing vagina with normal color and discharge, no lesions              Cervix: Suture present.  Small amount of vaginal granulation tissue in midline. Treated with AgNO3.                Bimanual Exam:  Uterus: absent              Adnexa: no mass, fullness, tenderness        Chaperone was present for exam:  Maudie Mercury, CMA  ASSESSMENT   Status post laparoscopic hysterectomy with bilateral salpingectomy.  Vaginal granulation tissue treated.   PLAN  Continue modified activity.  Return for a recheck in 1 week.    An After Visit Summary was printed and given to the patient.

## 2021-07-07 ENCOUNTER — Encounter: Payer: Self-pay | Admitting: Obstetrics and Gynecology

## 2021-07-10 NOTE — Progress Notes (Signed)
GYNECOLOGY  VISIT   HPI: 40 y.o.   Married  [place] American  female   [place] with Patient's last menstrual period was 04/02/2021 (exact date).   here for 13 weeks status post TOTAL LAPAROSCOPIC HYSTERECTOMY WITH BILATERAL SALPINGECTOMY, FULGURATION OF ENDOMETRIOSIS (Bilateral) CYSTOSCOPY Procedures.    GYNECOLOGIC HISTORY: Patient's last menstrual period was 04/02/2021 (exact date). 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   HLD (hyperlipidemia) 06/02/2016   Obesity 08/28/2015   Allergic rhinitis 10/26/2014   Contraceptive management 06/07/2014    Past Medical History:  Diagnosis Date   Allergy 03/19/1993   DJD (degenerative joint disease) 04/02/2021   LOWER BACK   Dysmenorrhea    Hypercholesteremia    STD (sexually transmitted disease)    chlamydia as teenager    Past Surgical History:  Procedure Laterality Date   ABDOMINAL HYSTERECTOMY  04/09/2021   CYSTOSCOPY N/A 04/09/2021   Procedure: CYSTOSCOPY;  Surgeon: Nunzio Cobbs, MD;  Location: [facility];  Service: Gynecology;  Laterality: N/A;   FRACTURE SURGERY  2004   Broke ankle   fractured ankle Left    20 YRS AGO, PLATE AND 7 SCREWS STILL IN PER PT ON 04-02-2021   MOUTH SURGERY  12/2020   METAL BAR PUT IN TO PUT TEETH BACK IN PLACE   TOTAL LAPAROSCOPIC HYSTERECTOMY WITH SALPINGECTOMY Bilateral 04/09/2021    Procedure: TOTAL LAPAROSCOPIC HYSTERECTOMY WITH BILATERAL SALPINGECTOMY, FULGURATION OF ENDOMETRIOSIS;  Surgeon: Nunzio Cobbs, MD;  Location: [facility]  Service: Gynecology;  Laterality: Bilateral;    Current Outpatient Medications  Medication Sig Dispense Refill   cetirizine (ZYRTEC) 10 MG tablet Take 1 tablet (10 mg total) by mouth daily. (Patient taking differently: Take 10 mg by mouth as needed.) 30 tablet 11   MAGNESIUM PO Take 1 tablet by mouth daily.     Omega-3 Fatty Acids (FISH OIL) 1000 MG CAPS Take 1 capsule by mouth daily.     OVER THE COUNTER MEDICATION Athletic [employer] Drinks once daily     No current facility-administered medications for this visit.     ALLERGIES: Statins and Tylenol with codeine #3 [acetaminophen-codeine]  Family History  Problem Relation Age of Onset   Diabetes Maternal Grandmother    Heart disease Maternal Grandmother    Hypertension Maternal Grandmother    Breast cancer Neg Hx     Social History   Socioeconomic History   Marital status: Married    Spouse name: Not on file   Number of children: Not on file   Years of education: Not on file   Highest education level: Not on file  Occupational  History   Not on file  Tobacco Use   Smoking status: Former    Types: Cigarettes    Quit date: 07/21/2004    Years since quitting: 16.9   Smokeless tobacco: Never   Tobacco comments:    Social smoker. Never bought packs  Vaping Use   Vaping Use: Never used  Substance and Sexual Activity   Alcohol use: Not Currently   Drug use: No   Sexual activity: Yes    Birth control/protection: None    Comment: Female partner  Other Topics Concern   Not on file  Social History Narrative   Not on file   Social Determinants of Health   Financial Resource Strain: Not on file  Food Insecurity: Not on file  Transportation Needs: Not on file  Physical Activity: Not on file  Stress: Not on file  Social Connections: Not on file   Intimate Partner Violence: Not on file    Review of Systems  All other systems reviewed and are negative.  PHYSICAL EXAMINATION:    BP 116/62 (BP Location: Right Arm)    Pulse 89    Resp 18    LMP 04/02/2021 (Exact Date)    SpO2 99%     General appearance: alert, cooperative and appears stated age    Pelvic: External genitalia:  no lesions              Urethra:  normal appearing urethra with no masses, tenderness or lesions              Bartholins and Skenes: normal                 Vagina: normal appearing vagina with normal color and discharge, 2 - 3 mm area of granulation tissue mid vaginal cuff, treated with AgNO3.  Some piece of suture free and removed with Q-Tip.               Cervix: absent                Bimanual Exam:  Uterus:  absent              Adnexa: no mass, fullness, tenderness                Chaperone was present for exam:  Lawson Radar., RN  ASSESSMENT  Status post laparoscopic hysterectomy.  Healing well post op.  Minor granulation tissue noted.   PLAN  Ok to return to all normal activity.  Annual exam in September, 2023.    An After Visit Summary was printed and given to the patient.

## 2021-07-11 ENCOUNTER — Encounter: Payer: Self-pay | Admitting: Obstetrics and Gynecology

## 2021-07-11 ENCOUNTER — Other Ambulatory Visit: Payer: Self-pay

## 2021-07-11 ENCOUNTER — Ambulatory Visit: Payer: BC Managed Care – PPO | Admitting: Obstetrics and Gynecology

## 2021-07-11 VITALS — BP 116/62 | HR 89 | Resp 18

## 2021-07-11 DIAGNOSIS — N898 Other specified noninflammatory disorders of vagina: Secondary | ICD-10-CM

## 2021-07-11 DIAGNOSIS — Z90710 Acquired absence of both cervix and uterus: Secondary | ICD-10-CM

## 2021-10-22 DIAGNOSIS — F4320 Adjustment disorder, unspecified: Secondary | ICD-10-CM | POA: Diagnosis not present

## 2021-11-05 DIAGNOSIS — F4320 Adjustment disorder, unspecified: Secondary | ICD-10-CM | POA: Diagnosis not present

## 2021-11-19 DIAGNOSIS — F4320 Adjustment disorder, unspecified: Secondary | ICD-10-CM | POA: Diagnosis not present

## 2021-12-03 DIAGNOSIS — F411 Generalized anxiety disorder: Secondary | ICD-10-CM | POA: Diagnosis not present

## 2021-12-19 DIAGNOSIS — F411 Generalized anxiety disorder: Secondary | ICD-10-CM | POA: Diagnosis not present

## 2022-02-26 ENCOUNTER — Other Ambulatory Visit: Payer: Self-pay | Admitting: Nurse Practitioner

## 2022-02-26 DIAGNOSIS — Z1231 Encounter for screening mammogram for malignant neoplasm of breast: Secondary | ICD-10-CM

## 2022-03-20 ENCOUNTER — Ambulatory Visit
Admission: RE | Admit: 2022-03-20 | Discharge: 2022-03-20 | Disposition: A | Discharge status: Home / Self Care | Payer: BC Managed Care – PPO | Source: Ambulatory Visit | Attending: Nurse Practitioner | Admitting: Nurse Practitioner

## 2022-03-20 DIAGNOSIS — Z1231 Encounter for screening mammogram for malignant neoplasm of breast: Secondary | ICD-10-CM

## 2022-04-08 NOTE — Progress Notes (Unsigned)
41 y.o. G0P0000 Married [place] American female here for annual exam.    Some back discomfort and some attitude changes each month. Symptoms last a day.  She calls it her "dry period." Exercises to feel better.   Patient expressed appreciation for having had her hysterectomy.  She feels better not having to plan around her periods anymore.   PCP:  Wilfred Lacy, NP   Patient's last menstrual period was 04/02/2021 (exact date).           Sexually active: No.  The current method of family planning is status post hysterectomy.    Exercising: Yes.     Kickboxing, running Smoker:  Former  Health Maintenance: Pap:   06-13-20 Neg:Neg HR HPV, 08-28-15 Neg:Neg HR HPV, 11-24-11 Neg:Neg HR HPV.  Cervix was benign with hysterectomy.  History of abnormal Pap:  no MMG:  03-20-22 Neg/BiRads1 Colonoscopy:  n/a BMD:   n/a  Result  n/a TDaP:  08-25-17 Gardasil:   no HIV:neg in past Hep C:no Screening Labs:  PCP    reports that she quit smoking about 17 years ago. Her smoking use included cigarettes. She has never used smokeless tobacco. She reports that she does not currently use alcohol. She reports that she does not use drugs.  Past Medical History:  Diagnosis Date   Allergy 03/19/1993   DJD (degenerative joint disease) 04/02/2021   LOWER BACK   Dysmenorrhea    Hypercholesteremia    Orthostatic hypotension    STD (sexually transmitted disease)    chlamydia as teenager    Past Surgical History:  Procedure Laterality Date   ABDOMINAL HYSTERECTOMY  04/09/2021   CYSTOSCOPY N/A 04/09/2021   Procedure: CYSTOSCOPY;  Surgeon: Nunzio Cobbs, MD;  Location: [facility];  Service: Gynecology;  Laterality: N/A;   FRACTURE SURGERY  2004   Broke ankle   fractured ankle Left    20 YRS AGO, PLATE AND 7 SCREWS STILL IN PER PT ON 04-02-2021   MOUTH SURGERY  12/2020   METAL BAR PUT IN TO PUT TEETH BACK IN PLACE   TOTAL LAPAROSCOPIC HYSTERECTOMY WITH SALPINGECTOMY Bilateral  04/09/2021   Procedure: TOTAL LAPAROSCOPIC HYSTERECTOMY WITH BILATERAL SALPINGECTOMY, FULGURATION OF ENDOMETRIOSIS;  Surgeon: Nunzio Cobbs, MD;  Location: [facility];  Service: Gynecology;  Laterality: Bilateral;    Current Outpatient Medications  Medication Sig Dispense Refill   cetirizine (ZYRTEC) 10 MG tablet Take 1 tablet (10 mg total) by mouth daily. (Patient taking differently: Take 10 mg by mouth as needed.) 30 tablet 11   MAGNESIUM PO Take 1 tablet by mouth daily.     Omega-3 Fatty Acids (FISH OIL) 1000 MG CAPS Take 1 capsule by mouth daily.     OVER THE COUNTER MEDICATION Athletic [employer] Drinks once daily     No current facility-administered medications for this visit.    Family History  Problem Relation Age of Onset   Diabetes Maternal Grandmother    Heart disease Maternal Grandmother    Hypertension Maternal Grandmother    Breast cancer Neg Hx     Review of Systems  All other systems reviewed and are negative.   Exam:   BP 100/62   Pulse 68   Ht 5' 2.5" (1.588 m)   Wt 143 lb (64.9 kg)   LMP 04/02/2021 (Exact Date)   SpO2 98%   BMI 25.74 kg/m     General appearance: alert, cooperative and appears stated age Head: normocephalic, without obvious abnormality,  atraumatic Neck: no adenopathy, supple, symmetrical, trachea midline and thyroid normal to inspection and palpation Lungs: clear to auscultation bilaterally Breasts: normal appearance, no masses or tenderness, No nipple retraction or dimpling, No nipple discharge or bleeding, No axillary adenopathy Heart: regular rate and rhythm Abdomen: soft, non-tender; no masses, no organomegaly Extremities: extremities normal, atraumatic, no cyanosis or edema Skin: skin color, texture, turgor normal. No rashes or lesions Lymph nodes: cervical, supraclavicular, and axillary nodes normal. Neurologic: grossly normal  Pelvic: External genitalia:  no lesions              No abnormal inguinal  nodes palpated.              Urethra:  normal appearing urethra with no masses, tenderness or lesions              Bartholins and Skenes: normal                 Vagina: normal appearing vagina with normal color and discharge, no lesions              Cervix: absent              Pap taken: no Bimanual Exam:  Uterus:  absent              Adnexa: no mass, fullness, tenderness              Rectal exam: declined.  Chaperone was present for exam:  Estill Bamberg, CMA  Assessment:   Well woman visit with gynecologic exam. Status post total laparoscopic hysterectomy with bilateral salpingectomy, fulguration of endometriosis, cystoscopy - adenomyosis, fibroids, minimal endometriosis.  Mild PMS symptoms.  Hx hyperlipidemia.  Plan: Mammogram screening discussed. Self breast awareness reviewed. Pap and HR HPV not indicated.  Guidelines for Calcium, Vitamin D, regular exercise program including cardiovascular and weight bearing exercise. She declines SSRI for PMS symptoms.  Labs with PCP.  Follow up annually and prn.   After visit summary provided.

## 2022-04-10 ENCOUNTER — Encounter: Payer: Self-pay | Admitting: Obstetrics and Gynecology

## 2022-04-10 ENCOUNTER — Ambulatory Visit (INDEPENDENT_AMBULATORY_CARE_PROVIDER_SITE_OTHER): Payer: BC Managed Care – PPO | Admitting: Obstetrics and Gynecology

## 2022-04-10 VITALS — BP 100/62 | HR 68 | Ht 62.5 in | Wt 143.0 lb

## 2022-04-10 DIAGNOSIS — Z01419 Encounter for gynecological examination (general) (routine) without abnormal findings: Secondary | ICD-10-CM | POA: Diagnosis not present

## 2022-04-10 NOTE — Patient Instructions (Signed)

## 2022-05-23 DIAGNOSIS — M9903 Segmental and somatic dysfunction of lumbar region: Secondary | ICD-10-CM | POA: Diagnosis not present

## 2022-05-23 DIAGNOSIS — M5137 Other intervertebral disc degeneration, lumbosacral region: Secondary | ICD-10-CM | POA: Diagnosis not present

## 2022-05-23 DIAGNOSIS — M9904 Segmental and somatic dysfunction of sacral region: Secondary | ICD-10-CM | POA: Diagnosis not present

## 2022-05-23 DIAGNOSIS — M9905 Segmental and somatic dysfunction of pelvic region: Secondary | ICD-10-CM | POA: Diagnosis not present

## 2022-05-23 DIAGNOSIS — M9902 Segmental and somatic dysfunction of thoracic region: Secondary | ICD-10-CM | POA: Diagnosis not present

## 2022-05-23 DIAGNOSIS — M5386 Other specified dorsopathies, lumbar region: Secondary | ICD-10-CM | POA: Diagnosis not present

## 2022-05-30 DIAGNOSIS — M9903 Segmental and somatic dysfunction of lumbar region: Secondary | ICD-10-CM | POA: Diagnosis not present

## 2022-05-30 DIAGNOSIS — M5137 Other intervertebral disc degeneration, lumbosacral region: Secondary | ICD-10-CM | POA: Diagnosis not present

## 2022-05-30 DIAGNOSIS — M9904 Segmental and somatic dysfunction of sacral region: Secondary | ICD-10-CM | POA: Diagnosis not present

## 2022-05-30 DIAGNOSIS — M9905 Segmental and somatic dysfunction of pelvic region: Secondary | ICD-10-CM | POA: Diagnosis not present

## 2022-05-30 DIAGNOSIS — M5386 Other specified dorsopathies, lumbar region: Secondary | ICD-10-CM | POA: Diagnosis not present

## 2022-06-20 DIAGNOSIS — M5137 Other intervertebral disc degeneration, lumbosacral region: Secondary | ICD-10-CM | POA: Diagnosis not present

## 2022-06-20 DIAGNOSIS — M5386 Other specified dorsopathies, lumbar region: Secondary | ICD-10-CM | POA: Diagnosis not present

## 2022-06-20 DIAGNOSIS — M9904 Segmental and somatic dysfunction of sacral region: Secondary | ICD-10-CM | POA: Diagnosis not present

## 2022-06-20 DIAGNOSIS — M9903 Segmental and somatic dysfunction of lumbar region: Secondary | ICD-10-CM | POA: Diagnosis not present

## 2022-06-20 DIAGNOSIS — M9905 Segmental and somatic dysfunction of pelvic region: Secondary | ICD-10-CM | POA: Diagnosis not present

## 2022-06-23 ENCOUNTER — Encounter: Payer: BC Managed Care – PPO | Admitting: Nurse Practitioner

## 2022-06-27 DIAGNOSIS — M9905 Segmental and somatic dysfunction of pelvic region: Secondary | ICD-10-CM | POA: Diagnosis not present

## 2022-06-27 DIAGNOSIS — M5137 Other intervertebral disc degeneration, lumbosacral region: Secondary | ICD-10-CM | POA: Diagnosis not present

## 2022-06-27 DIAGNOSIS — M9904 Segmental and somatic dysfunction of sacral region: Secondary | ICD-10-CM | POA: Diagnosis not present

## 2022-06-27 DIAGNOSIS — M5386 Other specified dorsopathies, lumbar region: Secondary | ICD-10-CM | POA: Diagnosis not present

## 2022-06-27 DIAGNOSIS — M9903 Segmental and somatic dysfunction of lumbar region: Secondary | ICD-10-CM | POA: Diagnosis not present

## 2022-07-04 DIAGNOSIS — M9905 Segmental and somatic dysfunction of pelvic region: Secondary | ICD-10-CM | POA: Diagnosis not present

## 2022-07-04 DIAGNOSIS — M5386 Other specified dorsopathies, lumbar region: Secondary | ICD-10-CM | POA: Diagnosis not present

## 2022-07-04 DIAGNOSIS — M5137 Other intervertebral disc degeneration, lumbosacral region: Secondary | ICD-10-CM | POA: Diagnosis not present

## 2022-07-04 DIAGNOSIS — M9903 Segmental and somatic dysfunction of lumbar region: Secondary | ICD-10-CM | POA: Diagnosis not present

## 2022-07-04 DIAGNOSIS — M9904 Segmental and somatic dysfunction of sacral region: Secondary | ICD-10-CM | POA: Diagnosis not present

## 2022-07-18 DIAGNOSIS — M5386 Other specified dorsopathies, lumbar region: Secondary | ICD-10-CM | POA: Diagnosis not present

## 2022-07-18 DIAGNOSIS — M9903 Segmental and somatic dysfunction of lumbar region: Secondary | ICD-10-CM | POA: Diagnosis not present

## 2022-07-18 DIAGNOSIS — M5137 Other intervertebral disc degeneration, lumbosacral region: Secondary | ICD-10-CM | POA: Diagnosis not present

## 2022-07-18 DIAGNOSIS — M9905 Segmental and somatic dysfunction of pelvic region: Secondary | ICD-10-CM | POA: Diagnosis not present

## 2022-07-18 DIAGNOSIS — M9904 Segmental and somatic dysfunction of sacral region: Secondary | ICD-10-CM | POA: Diagnosis not present

## 2022-07-25 DIAGNOSIS — M5386 Other specified dorsopathies, lumbar region: Secondary | ICD-10-CM | POA: Diagnosis not present

## 2022-07-25 DIAGNOSIS — M9904 Segmental and somatic dysfunction of sacral region: Secondary | ICD-10-CM | POA: Diagnosis not present

## 2022-07-25 DIAGNOSIS — M5137 Other intervertebral disc degeneration, lumbosacral region: Secondary | ICD-10-CM | POA: Diagnosis not present

## 2022-07-25 DIAGNOSIS — M9903 Segmental and somatic dysfunction of lumbar region: Secondary | ICD-10-CM | POA: Diagnosis not present

## 2022-07-25 DIAGNOSIS — M9905 Segmental and somatic dysfunction of pelvic region: Secondary | ICD-10-CM | POA: Diagnosis not present

## 2022-08-01 DIAGNOSIS — M5386 Other specified dorsopathies, lumbar region: Secondary | ICD-10-CM | POA: Diagnosis not present

## 2022-08-01 DIAGNOSIS — M5137 Other intervertebral disc degeneration, lumbosacral region: Secondary | ICD-10-CM | POA: Diagnosis not present

## 2022-08-01 DIAGNOSIS — M9905 Segmental and somatic dysfunction of pelvic region: Secondary | ICD-10-CM | POA: Diagnosis not present

## 2022-08-01 DIAGNOSIS — M9904 Segmental and somatic dysfunction of sacral region: Secondary | ICD-10-CM | POA: Diagnosis not present

## 2022-08-01 DIAGNOSIS — M9903 Segmental and somatic dysfunction of lumbar region: Secondary | ICD-10-CM | POA: Diagnosis not present

## 2022-08-08 DIAGNOSIS — M5137 Other intervertebral disc degeneration, lumbosacral region: Secondary | ICD-10-CM | POA: Diagnosis not present

## 2022-08-08 DIAGNOSIS — M9903 Segmental and somatic dysfunction of lumbar region: Secondary | ICD-10-CM | POA: Diagnosis not present

## 2022-08-08 DIAGNOSIS — M9904 Segmental and somatic dysfunction of sacral region: Secondary | ICD-10-CM | POA: Diagnosis not present

## 2022-08-08 DIAGNOSIS — M9905 Segmental and somatic dysfunction of pelvic region: Secondary | ICD-10-CM | POA: Diagnosis not present

## 2022-08-08 DIAGNOSIS — M5386 Other specified dorsopathies, lumbar region: Secondary | ICD-10-CM | POA: Diagnosis not present

## 2022-08-13 IMAGING — CT CT MAXILLOFACIAL W/O CM
3 series · 16 of 47 positions shown, 19 images · non-contrast
Comparison: None.

CLINICAL DATA: 40-year-old female with facial trauma.

EXAM:
CT MAXILLOFACIAL WITHOUT CONTRAST
TECHNIQUE: Multidetector CT imaging of the maxillofacial structures was
performed. Multiplanar CT image reconstructions were also generated.

[Series 3: facial/ orbits 2.0 h30s · axial · 0.37mm/px · z∈[+1108,+1248]mm · 10 of 82 slices shown, 13 images]
[im 6/82  brain]
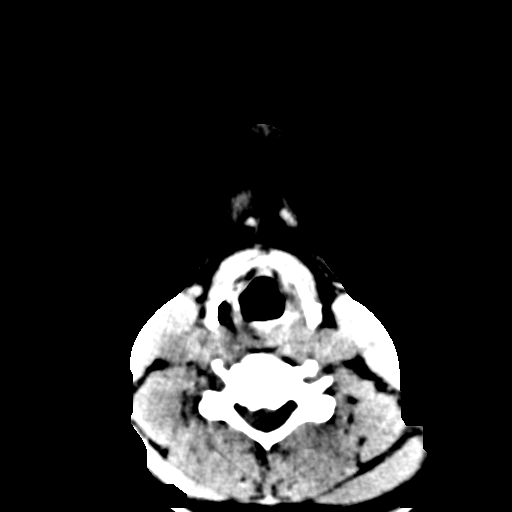
[im 6/82  bone]
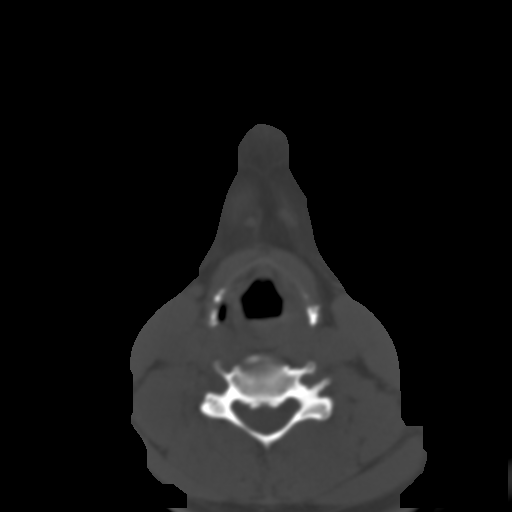
[im 14/82  bone]
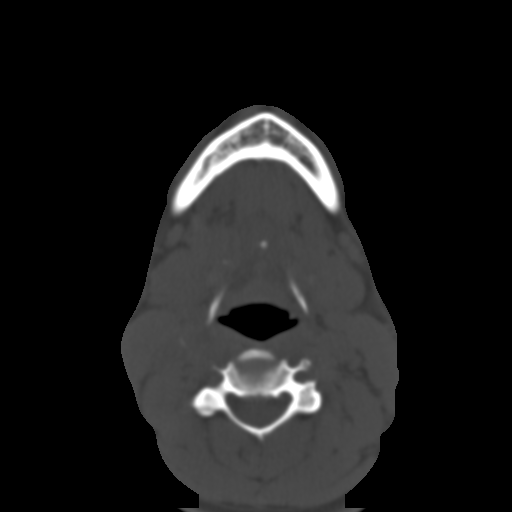
[im 23/82  bone]
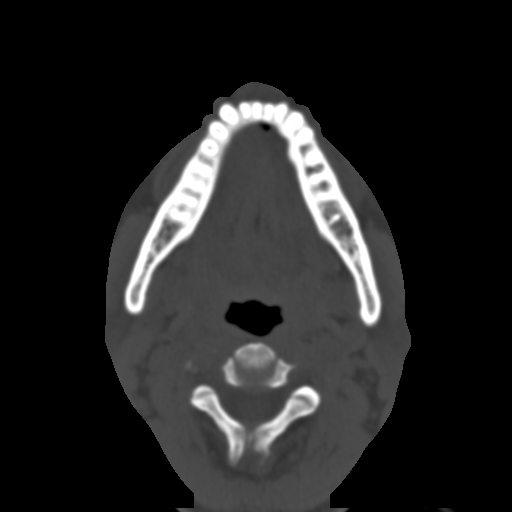
[im 28/82  bone]
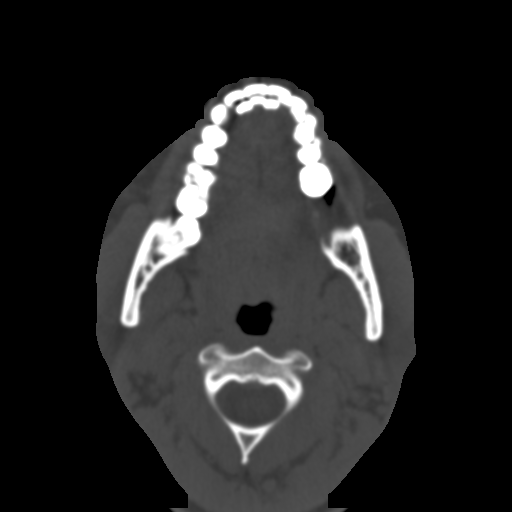
[im 37/82  brain]
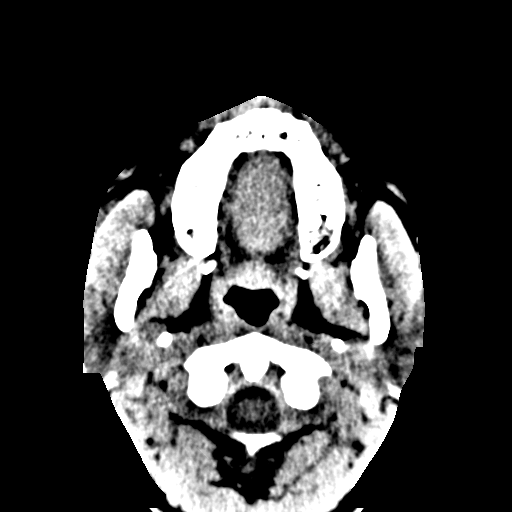
[im 37/82  bone]
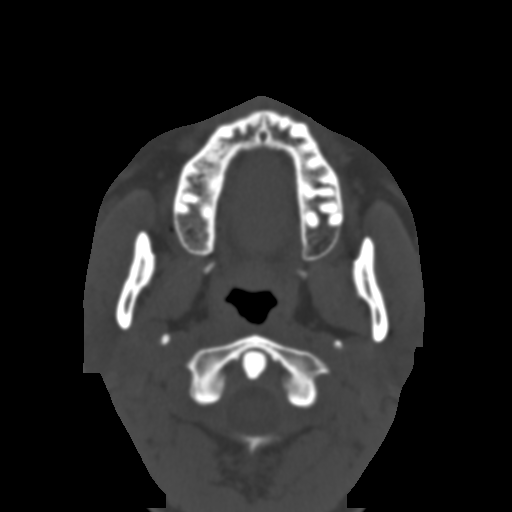
[im 45/82  bone]
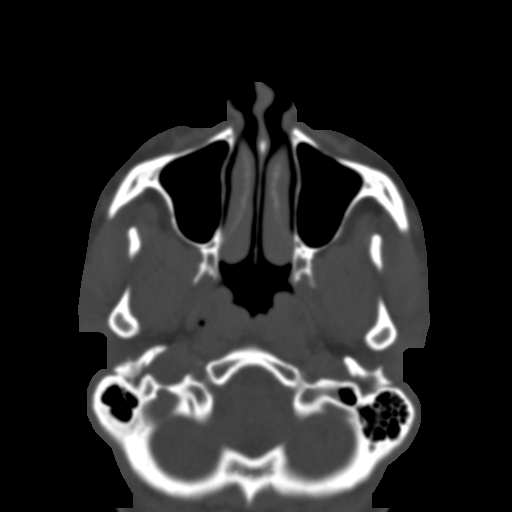
[im 54/82  bone]
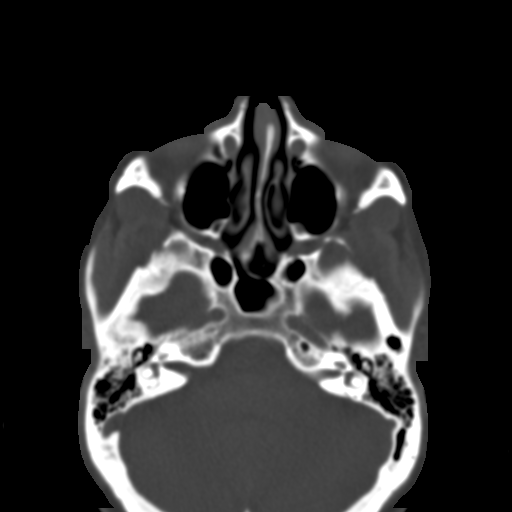
[im 62/82  bone]
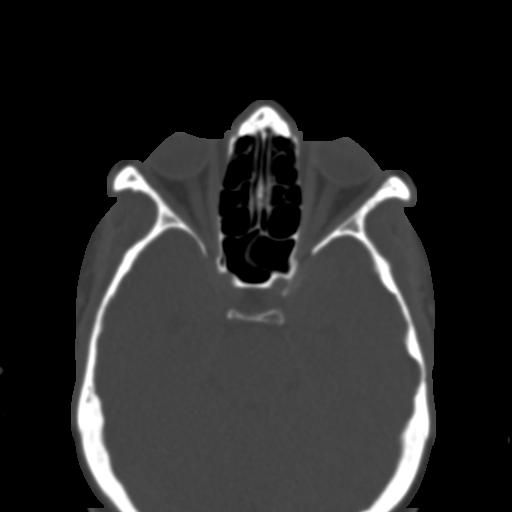
[im 68/82  brain]
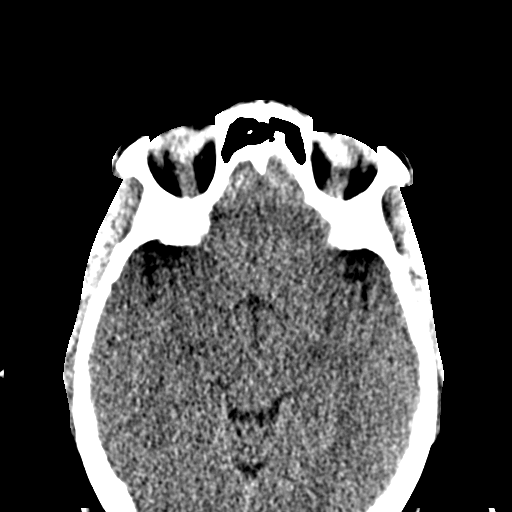
[im 68/82  bone]
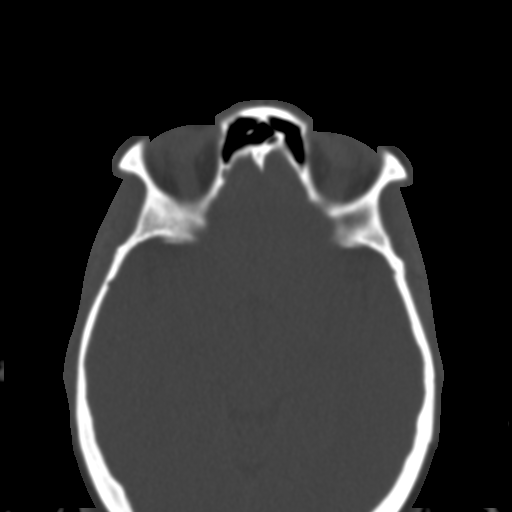
[im 76/82  bone]
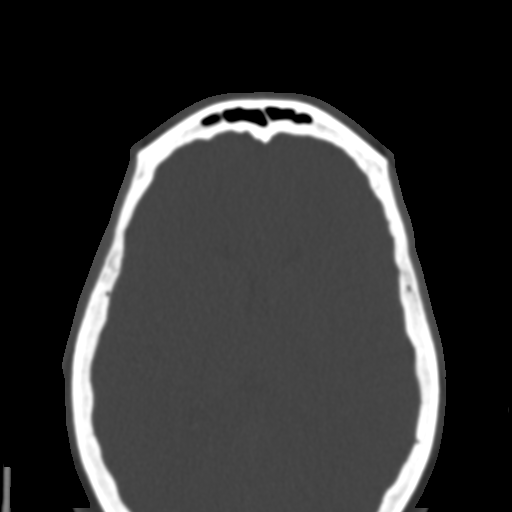

[Series 7: coronal soft tissue · coronal · 0.36mm/px · 3 of 84 slices shown]
[im 28/84  bone]
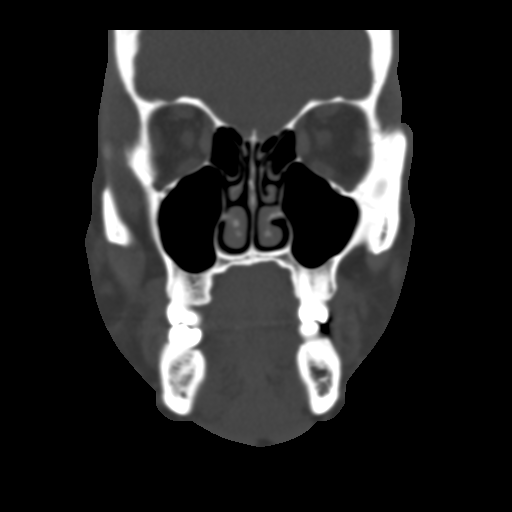
[im 37/84  bone]
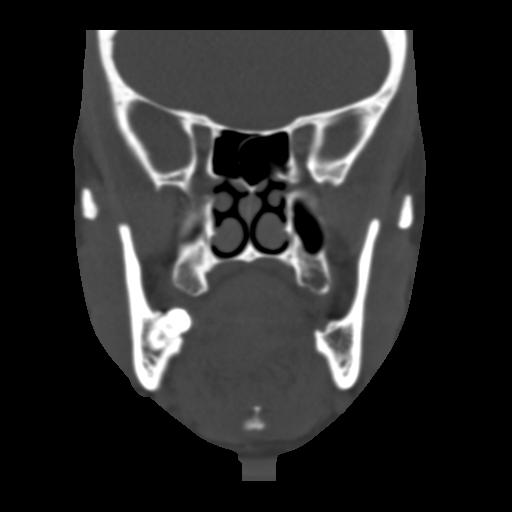
[im 47/84  bone]
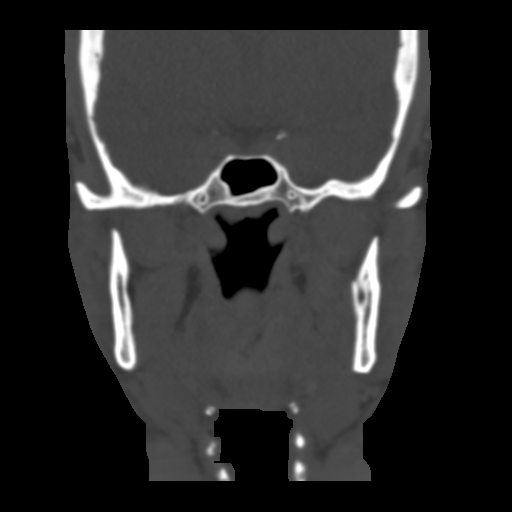

[Series 8: sagittal soft tissue · sagittal · 0.36mm/px · 3 of 81 slices shown]
[im 27/81  bone]
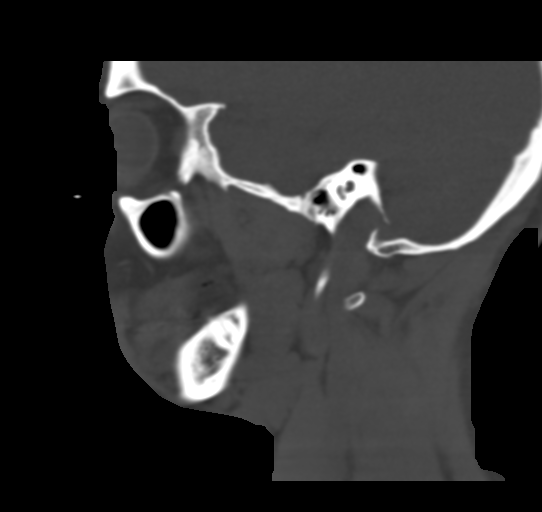
[im 41/81  bone]
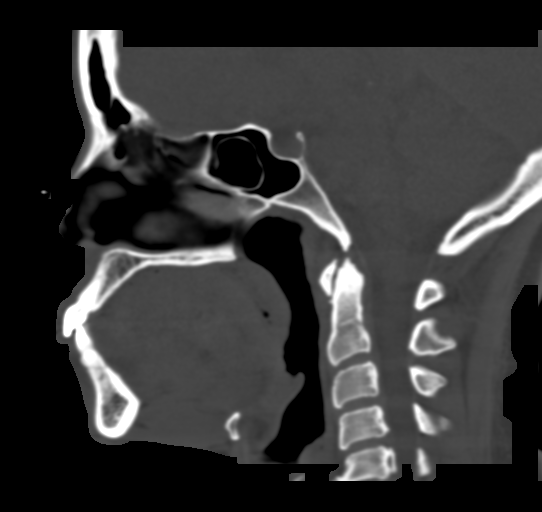
[im 54/81  bone]
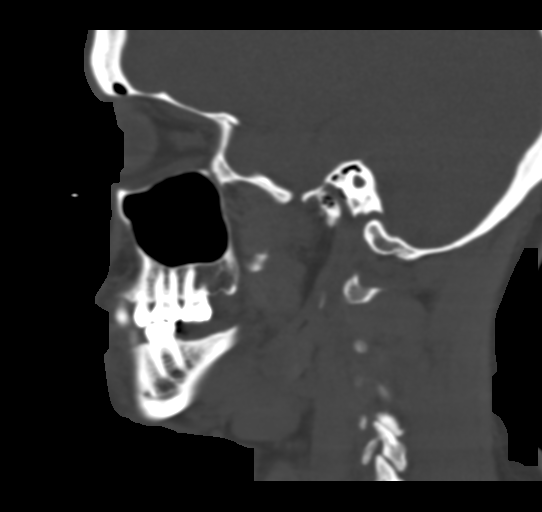

[16 of 47 positions shown; findings below may reference images not displayed]

FINDINGS: Osseous: There is a fracture of the anterior alveolar process of the
mandible with fracture line extending through the root of the
central incisor and right lateral incisor teeth (coronal [DATE] and
sagittal 42/10). There is loosening of the right lateral incisor
tooth. Correlation with clinical exam recommended. No other acute
fracture. No dislocation.

Orbits: Negative. No traumatic or inflammatory finding.

Sinuses: Clear.

Soft tissues: Negative.

Limited intracranial: No significant or unexpected finding.
IMPRESSION: Fracture of the anterior alveolar process of the mandible with
loosening of the right lateral incisor tooth. Correlation with
clinical exam recommended.

## 2022-08-15 DIAGNOSIS — M9904 Segmental and somatic dysfunction of sacral region: Secondary | ICD-10-CM | POA: Diagnosis not present

## 2022-08-15 DIAGNOSIS — M5386 Other specified dorsopathies, lumbar region: Secondary | ICD-10-CM | POA: Diagnosis not present

## 2022-08-15 DIAGNOSIS — M5137 Other intervertebral disc degeneration, lumbosacral region: Secondary | ICD-10-CM | POA: Diagnosis not present

## 2022-08-15 DIAGNOSIS — M9905 Segmental and somatic dysfunction of pelvic region: Secondary | ICD-10-CM | POA: Diagnosis not present

## 2022-08-15 DIAGNOSIS — M9903 Segmental and somatic dysfunction of lumbar region: Secondary | ICD-10-CM | POA: Diagnosis not present

## 2022-10-29 ENCOUNTER — Encounter (HOSPITAL_COMMUNITY): Payer: Self-pay

## 2022-10-29 ENCOUNTER — Ambulatory Visit (HOSPITAL_COMMUNITY)
Admission: EM | Admit: 2022-10-29 | Discharge: 2022-10-29 | Disposition: A | Discharge status: Home / Self Care | Payer: BC Managed Care – PPO | Attending: Family Medicine | Admitting: Family Medicine

## 2022-10-29 DIAGNOSIS — R42 Dizziness and giddiness: Secondary | ICD-10-CM | POA: Diagnosis not present

## 2022-10-29 DIAGNOSIS — R0789 Other chest pain: Secondary | ICD-10-CM | POA: Diagnosis not present

## 2022-10-29 LAB — CBC WITH DIFFERENTIAL/PLATELET
Abs Immature Granulocytes: 0.03 10*3/uL (ref 0.00–0.07)
Basophils Absolute: 0 10*3/uL (ref 0.0–0.1)
Basophils Relative: 0 %
Eosinophils Absolute: 0.1 10*3/uL (ref 0.0–0.5)
Eosinophils Relative: 1 %
HCT: 39.5 % (ref 36.0–46.0)
Hemoglobin: 13.4 g/dL (ref 12.0–15.0)
Immature Granulocytes: 0 %
Lymphocytes Relative: 13 %
Lymphs Abs: 1.1 10*3/uL (ref 0.7–4.0)
MCH: 30.5 pg (ref 26.0–34.0)
MCHC: 33.9 g/dL (ref 30.0–36.0)
MCV: 90 fL (ref 80.0–100.0)
Monocytes Absolute: 0.5 10*3/uL (ref 0.1–1.0)
Monocytes Relative: 6 %
Neutro Abs: 6.8 10*3/uL (ref 1.7–7.7)
Neutrophils Relative %: 80 %
Platelets: 270 10*3/uL (ref 150–400)
RBC: 4.39 MIL/uL (ref 3.87–5.11)
RDW: 13.1 % (ref 11.5–15.5)
WBC: 8.5 10*3/uL (ref 4.0–10.5)
nRBC: 0 % (ref 0.0–0.2)

## 2022-10-29 LAB — BASIC METABOLIC PANEL
Anion gap: 10 (ref 5–15)
BUN: 8 mg/dL (ref 6–20)
CO2: 25 mmol/L (ref 22–32)
Calcium: 9.5 mg/dL (ref 8.9–10.3)
Chloride: 102 mmol/L (ref 98–111)
Creatinine, Ser: 0.63 mg/dL (ref 0.44–1.00)
GFR, Estimated: >60 mL/min (ref >60)
Glucose, Bld: 101 mg/dL — ABNORMAL HIGH (ref 70–99)
Potassium: 4.2 mmol/L (ref 3.5–5.1)
Sodium: 137 mmol/L (ref 135–145)

## 2022-10-29 NOTE — ED Provider Notes (Signed)
[facility]    CSN: [id number] Arrival date & time: 10/29/22  1231      History   Chief Complaint Chief Complaint  Patient presents with   Chest Pain    HPI Patty Garner is a 42 y.o. female.   Earlier today she was bending over getting her hair washed.  She then got dizzy, nauseated, then in a cold sweat.  Her HR was 52 at the time.  Her chest was "heavy" at the time.  It resolved after getting here.  Was not sharp pain.  She did feel slightly sob as well.  She does have h/o orthostatic hypotension.  She did eat some salt and felt better.  In the last several months having having odd sensations in her arms bilaterally.  At this time time she feels "sleepy" but otherwise feels normal.   This weekend she did "party" which she does not usually do.  She has lost about 30lbs by working out, eating healthy.  She walks, does kick boxing, etc.  No issues while working out.        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   HLD (hyperlipidemia) 06/02/2016   Obesity 08/28/2015   Allergic rhinitis 10/26/2014   Contraceptive management 06/07/2014    Past Surgical History:  Procedure Laterality Date   ABDOMINAL HYSTERECTOMY  04/09/2021   CYSTOSCOPY N/A 04/09/2021   Procedure: CYSTOSCOPY;  Surgeon: Patton Salles, MD;  Location: [facility];  Service: Gynecology;  Laterality: N/A;   FRACTURE SURGERY  2004   Broke ankle   fractured ankle Left    20 YRS AGO, PLATE AND 7 SCREWS  STILL IN PER PT ON 04-02-2021   MOUTH SURGERY  12/2020   METAL BAR PUT IN TO PUT TEETH BACK IN PLACE   TOTAL LAPAROSCOPIC HYSTERECTOMY WITH SALPINGECTOMY Bilateral 04/09/2021   Procedure: TOTAL LAPAROSCOPIC HYSTERECTOMY WITH BILATERAL SALPINGECTOMY, FULGURATION OF ENDOMETRIOSIS;  Surgeon: Patton Salles, MD;  Location: [facility];  Service: Gynecology;  Laterality: Bilateral;    OB History     Gravida  0   Para  0   Term  0   Preterm  0   AB  0   Living  0      SAB  0   IAB  0   Ectopic  0   Multiple  0   Live Births  0            Home Medications    Prior to Admission medications   Medication Sig Start Date End Date Taking? Authorizing Provider  cetirizine (ZYRTEC) 10 MG tablet Take 1 tablet (10 mg total) by mouth daily. Patient taking differently: Take 10 mg by mouth as needed. 10/26/14   Dianne Dun, MD  MAGNESIUM PO Take 1 tablet by mouth daily.    [provider]  Omega-3 Fatty Acids (FISH OIL) 1000 MG CAPS Take 1 capsule by mouth daily.    [provider]  OVER THE COUNTER MEDICATION Athletic [employer] Drinks once daily    [provider]    Family History Family History  Problem Relation Age of Onset   Diabetes Maternal Grandmother    Heart disease Maternal Grandmother    Hypertension Maternal Grandmother    Breast cancer Neg Hx     Social History Social History   Tobacco Use   Smoking status: Former    Types: Cigarettes    Quit date: 07/21/2004    Years since quitting: 18.2   Smokeless tobacco: Never   Tobacco comments:    Social smoker. Never bought packs  Vaping Use   Vaping Use: Never used  Substance Use Topics   Alcohol use: Not Currently   Drug use: No     Allergies   Statins and Tylenol with codeine #3 [acetaminophen-codeine]   Review of Systems Review of Systems  Constitutional:  Positive for chills. Negative for fever.  HENT: Negative.    Respiratory:  Positive for  shortness of breath.   Cardiovascular:  Positive for chest pain. Negative for palpitations.  Gastrointestinal:  Positive for nausea.  Genitourinary: Negative.   Musculoskeletal: Negative.   Skin: Negative.   Hematological: Negative.   Psychiatric/Behavioral: Negative.       Physical Exam Triage Vital Signs ED Triage Vitals  Enc Vitals Group     BP 10/29/22 1238 129/83     Pulse Rate 10/29/22 1238 78     Resp 10/29/22 1238 20     Temp --      Temp src --      SpO2 10/29/22 1238 99 %     Weight --      Height --      Head Circumference --      Peak Flow --      Pain Score 10/29/22 1248 5     Pain Loc --      Pain Edu? --      Excl. in GC? --    No data found.  Updated Vital Signs BP 129/83 (BP Location: Left Arm)   Pulse 78   Resp 20   LMP 04/02/2021 (Exact Date)   SpO2 99%   Visual Acuity Right Eye Distance:   Left Eye Distance:   Bilateral Distance:    Right Eye Near:   Left Eye Near:    Bilateral Near:     Physical Exam Constitutional:      Appearance: She is well-developed.  Cardiovascular:     Rate and Rhythm: Normal rate and regular rhythm.     Heart sounds: Normal heart sounds.  Pulmonary:     Effort: Pulmonary effort is normal.     Breath sounds: Normal breath sounds.  Chest:     Chest wall: No tenderness.  Abdominal:     Palpations: Abdomen is soft.  Musculoskeletal:        General: Normal range of motion.     Cervical back: Normal range of motion.  Skin:    General: Skin is warm.  Neurological:     Mental Status: She is alert.      UC Treatments / Results  Labs (all labs ordered are listed, but only abnormal results are displayed) Labs Reviewed  CBC WITH DIFFERENTIAL/PLATELET  BASIC METABOLIC PANEL    EKG EKG: NSR;  normal EKG  Radiology No results found.  Procedures Procedures (including critical care time)  Medications Ordered in UC Medications - No data to display  Initial Impression / Assessment and Plan /  UC  Course  I have reviewed the triage vital signs and the nursing notes.  Pertinent labs & imaging results that were available during my care of the patient were reviewed by me and considered in my medical decision making (see chart for details).  Final Clinical Impressions(s) / UC Diagnoses   Final diagnoses:  Dizziness  Atypical chest pain     Discharge Instructions      You were seen today for dizziness and chest discomfort.  Your EKG was normal, and you are feeling better at this time.  I have ordered lab work for further testing, and you will be notified if anything abnormal.  I recommend you follow up with your primary care provider.  If you have any other episodes of chest pain, sob or dizziness then please go to the ER for further testing.      ED Prescriptions   None    PDMP not reviewed this encounter.   Jannifer Franklin, MD 10/29/22 1353

## 2022-10-29 NOTE — Discharge Instructions (Addendum)
You were seen today for dizziness and chest discomfort.  Your EKG was normal, and you are feeling better at this time.  I have ordered lab work for further testing, and you will be notified if anything abnormal.  I recommend you follow up with your primary care provider.  If you have any other episodes of chest pain, sob or dizziness then please go to the ER for further testing.

## 2022-10-29 NOTE — ED Triage Notes (Signed)
Patient was getting her hair washed. Got dizzy and chest tightness, H/o orthostatic hypotension. Patient nauseous, SOB, but no heart racing.

## 2022-11-25 DIAGNOSIS — Z111 Encounter for screening for respiratory tuberculosis: Secondary | ICD-10-CM | POA: Diagnosis not present

## 2022-11-27 DIAGNOSIS — Z111 Encounter for screening for respiratory tuberculosis: Secondary | ICD-10-CM | POA: Diagnosis not present

## 2023-01-13 ENCOUNTER — Encounter: Payer: Self-pay | Admitting: Obstetrics and Gynecology

## 2023-01-14 NOTE — Telephone Encounter (Signed)
Will route to provider for final review and close.  

## 2023-01-27 DIAGNOSIS — Z20822 Contact with and (suspected) exposure to covid-19: Secondary | ICD-10-CM | POA: Diagnosis not present

## 2023-01-27 DIAGNOSIS — Z6826 Body mass index (BMI) 26.0-26.9, adult: Secondary | ICD-10-CM | POA: Diagnosis not present

## 2023-01-27 DIAGNOSIS — R059 Cough, unspecified: Secondary | ICD-10-CM | POA: Diagnosis not present

## 2023-04-16 ENCOUNTER — Ambulatory Visit: Payer: BC Managed Care – PPO | Admitting: Obstetrics and Gynecology

## 2023-04-22 ENCOUNTER — Other Ambulatory Visit: Payer: Self-pay | Admitting: Nurse Practitioner

## 2023-04-22 DIAGNOSIS — Z1231 Encounter for screening mammogram for malignant neoplasm of breast: Secondary | ICD-10-CM

## 2023-05-14 ENCOUNTER — Encounter: Payer: BC Managed Care – PPO | Admitting: Nurse Practitioner

## 2023-05-14 ENCOUNTER — Ambulatory Visit
Admission: RE | Admit: 2023-05-14 | Discharge: 2023-05-14 | Disposition: A | Discharge status: Home / Self Care | Payer: BC Managed Care – PPO | Source: Ambulatory Visit

## 2023-05-14 DIAGNOSIS — Z1231 Encounter for screening mammogram for malignant neoplasm of breast: Secondary | ICD-10-CM

## 2023-06-11 ENCOUNTER — Encounter: Payer: BC Managed Care – PPO | Admitting: Nurse Practitioner

## 2023-08-25 ENCOUNTER — Encounter: Payer: BC Managed Care – PPO | Admitting: Nurse Practitioner

## 2023-09-02 NOTE — Progress Notes (Deleted)
 GYNECOLOGY  VISIT   HPI: 43 y.o.   Divorced  [place] female   [id number] with Patient's last menstrual period was 04/02/2021 (exact date).   here for: pain     GYNECOLOGIC HISTORY: Patient's last menstrual period was 04/02/2021 (exact date). Contraception:  female partners Menopausal hormone therapy:  n/a Last 2 paps:  06-13-20 Neg:Neg HR HPV, 08-28-15 Neg:Neg HR HPV, 11-24-11 Neg:Neg HR HPV.  Cervix was benign with hysterectomy.  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   HLD (hyperlipidemia) 06/02/2016   Obesity 08/28/2015   Allergic rhinitis 10/26/2014   Contraceptive management 06/07/2014    Past Medical History:  Diagnosis Date   Allergy 03/19/1993   DJD (degenerative joint disease) 04/02/2021   LOWER BACK   Dysmenorrhea    Hypercholesteremia    Orthostatic hypotension    STD (sexually transmitted disease)    chlamydia as teenager    Past Surgical History:  Procedure Laterality Date   ABDOMINAL HYSTERECTOMY  04/09/2021   CYSTOSCOPY N/A 04/09/2021   Procedure: CYSTOSCOPY;  Surgeon: Patton Salles, MD;  Location: [facility];  Service: Gynecology;  Laterality: N/A;   FRACTURE SURGERY  2004   Broke ankle   fractured ankle Left    20 YRS AGO, PLATE AND 7 SCREWS STILL IN PER PT ON 04-02-2021   MOUTH SURGERY  12/2020   METAL BAR PUT IN TO PUT TEETH BACK IN PLACE   TOTAL LAPAROSCOPIC HYSTERECTOMY WITH SALPINGECTOMY Bilateral 04/09/2021    Procedure: TOTAL LAPAROSCOPIC HYSTERECTOMY WITH BILATERAL SALPINGECTOMY, FULGURATION OF ENDOMETRIOSIS;  Surgeon: Patton Salles, MD;  Location: [facility];  Service: Gynecology;  Laterality: Bilateral;    No current outpatient medications on file.   No current facility-administered medications for this visit.     ALLERGIES: Statins and Tylenol with codeine #3 [acetaminophen-codeine]  Family History  Problem Relation Age of Onset   Diabetes Maternal Grandmother    Heart disease Maternal Grandmother    Hypertension Maternal Grandmother    Breast cancer Neg Hx     Social History   Socioeconomic History   Marital status: Divorced    Spouse name: Not on file   Number of children: Not on file   Years of education: Not on file   Highest education level: Not on file  Occupational History   Not on file  Tobacco Use   Smoking status: Former    Current packs/day: 0.00    Types: Cigarettes    Quit date: 07/21/2004    Years since quitting: 19.1   Smokeless tobacco: Never   Tobacco comments:    Social smoker. Never bought packs  Vaping Use   Vaping  status: Never Used  Substance and Sexual Activity   Alcohol use: Not Currently   Drug use: No   Sexual activity: Not Currently    Birth control/protection: None    Comment: Female partner  Other Topics Concern   Not on file  Social History Narrative   Not on file   Social Drivers of Health   Financial Resource Strain: Not on file  Food Insecurity: Not on file  Transportation Needs: Not on file  Physical Activity: Not on file  Stress: Not on file  Social Connections: Not on file  Intimate Partner Violence: Not on file    Review of Systems  PHYSICAL EXAMINATION:   LMP 04/02/2021 (Exact Date)     General appearance: alert, cooperative and appears stated age Head: Normocephalic, without obvious abnormality, atraumatic Neck: no adenopathy, supple, symmetrical, trachea midline and thyroid normal to  inspection and palpation Lungs: clear to auscultation bilaterally Breasts: normal appearance, no masses or tenderness, No nipple retraction or dimpling, No nipple discharge or bleeding, No axillary or supraclavicular adenopathy Heart: regular rate and rhythm Abdomen: soft, non-tender, no masses,  no organomegaly Extremities: extremities normal, atraumatic, no cyanosis or edema Skin: Skin color, texture, turgor normal. No rashes or lesions Lymph nodes: Cervical, supraclavicular, and axillary nodes normal. No abnormal inguinal nodes palpated Neurologic: Grossly normal  Pelvic: External genitalia:  no lesions              Urethra:  normal appearing urethra with no masses, tenderness or lesions              Bartholins and Skenes: normal                 Vagina: normal appearing vagina with normal color and discharge, no lesions              Cervix: no lesions                Bimanual Exam:  Uterus:  normal size, contour, position, consistency, mobility, non-tender              Adnexa: no mass, fullness, tenderness              Rectal exam: {yes no:314532}.  Confirms.              Anus:  normal sphincter tone, no lesions  Chaperone was present for exam:  {BSCHAPERONE:31226::"Kamaal Cast F, CMA"}  ASSESSMENT:    PLAN:    {LABS (Optional):23779}  ***  total time was spent for this patient encounter, including preparation, face-to-face counseling with the patient, coordination of care, and documentation of the encounter.

## 2023-09-11 ENCOUNTER — Ambulatory Visit (HOSPITAL_COMMUNITY): Payer: BC Managed Care – PPO

## 2023-09-14 ENCOUNTER — Ambulatory Visit: Payer: BC Managed Care – PPO

## 2023-09-14 ENCOUNTER — Ambulatory Visit (HOSPITAL_COMMUNITY)
Admission: EM | Admit: 2023-09-14 | Discharge: 2023-09-14 | Disposition: A | Discharge status: Home / Self Care | Payer: BC Managed Care – PPO | Attending: Emergency Medicine | Admitting: Emergency Medicine

## 2023-09-14 ENCOUNTER — Encounter (HOSPITAL_COMMUNITY): Payer: Self-pay | Admitting: Emergency Medicine

## 2023-09-14 DIAGNOSIS — J0100 Acute maxillary sinusitis, unspecified: Secondary | ICD-10-CM

## 2023-09-14 MED ORDER — FLUCONAZOLE 150 MG PO TABS: ORAL_TABLET | ORAL | 0 refills | Status: DC

## 2023-09-14 MED ORDER — AMOXICILLIN-POT CLAVULANATE 875-125 MG PO TABS: 1.0000 | ORAL_TABLET | Freq: Two times a day (BID) | ORAL | 0 refills | Status: DC

## 2023-09-14 NOTE — ED Provider Notes (Signed)
 [facility]    CSN: [id number] Arrival date & time: 09/14/23  0900      History   Chief Complaint Chief Complaint  Patient presents with   Cough   Headache    HPI Patty Garner is a 43 y.o. female.   Patient presents with productive cough, congestion, sinus pain/pressure, headache, and voice hoarseness since 2/19. Denies shortness of breath, chest pain, known fever, abdominal pain, nausea, vomiting, and diarrhea. Reports she has been taking Theraflu with minimal relief.    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   HLD (hyperlipidemia) 06/02/2016   Obesity 08/28/2015   Allergic rhinitis 10/26/2014   Contraceptive management 06/07/2014    Past Surgical History:  Procedure Laterality Date   ABDOMINAL HYSTERECTOMY  04/09/2021   CYSTOSCOPY N/A 04/09/2021   Procedure: CYSTOSCOPY;  Surgeon: Patton Salles, MD;  Location: [facility];  Service: Gynecology;  Laterality: N/A;   FRACTURE SURGERY  2004   Broke ankle   fractured ankle Left    20 YRS AGO, PLATE AND 7 SCREWS STILL IN PER PT ON 04-02-2021   MOUTH SURGERY  12/2020   METAL BAR PUT IN TO PUT TEETH BACK IN PLACE   TOTAL LAPAROSCOPIC HYSTERECTOMY WITH SALPINGECTOMY Bilateral 04/09/2021   Procedure: TOTAL LAPAROSCOPIC HYSTERECTOMY WITH BILATERAL SALPINGECTOMY, FULGURATION OF ENDOMETRIOSIS;  Surgeon: Patton Salles, MD;  Location:  [facility];  Service: Gynecology;  Laterality: Bilateral;    OB History     Gravida  0   Para  0   Term  0   Preterm  0   AB  0   Living  0      SAB  0   IAB  0   Ectopic  0   Multiple  0   Live Births  0            Home Medications    Prior to Admission medications   Medication Sig Start Date End Date Taking? Authorizing Provider  amoxicillin-clavulanate (AUGMENTIN) 875-125 MG tablet Take 1 tablet by mouth every 12 (twelve) hours. 09/14/23  Yes Wynonia Lawman A, NP  fluconazole (DIFLUCAN) 150 MG tablet Take one tablet today and one tablet in 3 days if symptoms persist. 09/14/23  Yes Letta Kocher, NP    Family History Family History  Problem Relation Age of Onset   Diabetes Maternal Grandmother    Heart disease Maternal Grandmother    Hypertension Maternal Grandmother    Breast cancer Neg Hx     Social History Social History   Tobacco Use   Smoking status: Former    Current packs/day: 0.00    Types: Cigarettes    Quit date: 07/21/2004    Years since quitting: 19.1   Smokeless tobacco: Never   Tobacco comments:    Social smoker. Never bought packs  Vaping Use   Vaping status: Never Used  Substance Use Topics   Alcohol use: Not Currently   Drug use: No     Allergies   Statins and Tylenol with codeine #3 [acetaminophen-codeine]   Review of Systems Review of Systems  Respiratory:  Positive for cough.   Neurological:  Positive for headaches.   Per HPI  Physical Exam Triage Vital Signs ED Triage Vitals  Encounter Vitals Group     BP 09/14/23 0918 123/83     Systolic BP Percentile --      Diastolic BP Percentile --      Pulse Rate 09/14/23 0918 76     Resp 09/14/23 0918 15     Temp 09/14/23 0918 98.6 F (37 C)     Temp Source 09/14/23 0918 Oral     SpO2 09/14/23 0918 98 %     Weight --      Height --      Head Circumference --      Peak Flow --      Pain Score 09/14/23 0917 4     Pain Loc --      Pain  Education --      Exclude from Growth Chart --    No data found.  Updated Vital Signs BP 123/83 (BP Location: Left Arm)   Pulse 76   Temp 98.6 F (37 C) (Oral)   Resp 15   LMP 04/02/2021 (Exact Date)   SpO2 98%   Visual Acuity Right Eye Distance:   Left Eye Distance:   Bilateral Distance:    Right Eye Near:   Left Eye Near:    Bilateral Near:     Physical Exam Vitals and nursing note reviewed.  Constitutional:      General: She is awake. She is not in acute distress.    Appearance: Normal appearance. She is well-developed and well-groomed. She is not ill-appearing.  HENT:     Right Ear: Tympanic membrane, ear canal and external ear normal.     Left Ear: Tympanic membrane, ear canal and external ear normal.     Nose: Congestion and rhinorrhea present.     Right Sinus: Maxillary sinus tenderness present. No frontal sinus tenderness.     Left Sinus: Maxillary sinus tenderness present. No frontal sinus tenderness.     Mouth/Throat:     Mouth: Mucous membranes are moist.     Pharynx: Posterior oropharyngeal erythema present. No oropharyngeal exudate.  Cardiovascular:     Rate and Rhythm: Normal rate and regular rhythm.  Pulmonary:     Effort: Pulmonary effort is normal.     Breath sounds: Normal breath sounds.  Musculoskeletal:     Cervical back: Normal range of motion and neck supple.  Skin:    General: Skin is warm and dry.  Neurological:     Mental Status: She is alert.  Psychiatric:        Behavior: Behavior is cooperative.      UC Treatments / Results  Labs (all labs ordered are listed, but only abnormal results are displayed) Labs Reviewed - No data to display  EKG   Radiology No results found.  Procedures Procedures (including critical care time)  Medications Ordered in UC Medications - No data to display  Initial Impression / Assessment and Plan / UC Course  I have reviewed the triage vital signs and the nursing notes.  Pertinent labs &  imaging results that were available during my care of the patient were reviewed by me and considered in my medical decision  making (see chart for details).     Patient presented with productive cough, congestion, sinus pain/pressure, headache, and voice hoarseness since 2/19.   Upon assessment congestion and rhinorrhea are present, mild erythema noted to pharynx. Bilateral maxillary tenderness noted. Lungs clear bilaterally on auscultation.   Prescribed Augmentin for maxillary sinusitis. Prescribed Diflucan for yeast infection prevention per patient request. Discussed OTC medication for symptoms. Discussed return precautions.  Final Clinical Impressions(s) / UC Diagnoses   Final diagnoses:  Acute non-recurrent maxillary sinusitis     Discharge Instructions      Start taking Augmentin twice daily for 7 days for sinus infection. I have also sent Diflucan to take for prevention of yeast infection. You can take one tablet today when you start antibiotics and another tablet in 3 days if you develop symptoms and they persist.   You also take Mucinex to help with cough and congestion. Otherwise alternate between Tylenol and Ibuprofen every 4-6 hours as needed for headache, pain, or fever. Stay hydrated and get some rest. Return here if symptoms persist or worsen.    ED Prescriptions     Medication Sig Dispense Auth. Provider   amoxicillin-clavulanate (AUGMENTIN) 875-125 MG tablet Take 1 tablet by mouth every 12 (twelve) hours. 14 tablet Susann Givens, Eudora Guevarra A, NP   fluconazole (DIFLUCAN) 150 MG tablet Take one tablet today and one tablet in 3 days if symptoms persist. 2 tablet Wynonia Lawman A, NP      PDMP not reviewed this encounter.   Wynonia Lawman A, NP 09/14/23 1012

## 2023-09-14 NOTE — ED Triage Notes (Signed)
 Pt reports since last Wed had cough, congestion, headache, hoarse voice. 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 Taking theraflu.

## 2023-09-14 NOTE — Discharge Instructions (Addendum)
 Start taking Augmentin twice daily for 7 days for sinus infection. I have also sent Diflucan to take for prevention of yeast infection. You can take one tablet today when you start antibiotics and another tablet in 3 days if you develop symptoms and they persist.   You also take Mucinex to help with cough and congestion. Otherwise alternate between Tylenol and Ibuprofen every 4-6 hours as needed for headache, pain, or fever. Stay hydrated and get some rest. Return here if symptoms persist or worsen.

## 2023-09-16 ENCOUNTER — Ambulatory Visit: Payer: BC Managed Care – PPO | Admitting: Obstetrics and Gynecology

## 2023-10-29 ENCOUNTER — Ambulatory Visit: Payer: BC Managed Care – PPO | Admitting: Nurse Practitioner

## 2024-02-12 ENCOUNTER — Ambulatory Visit: Admitting: Family Medicine

## 2024-03-17 ENCOUNTER — Encounter: Admitting: Nurse Practitioner

## 2024-03-17 ENCOUNTER — Ambulatory Visit: Admitting: Nurse Practitioner

## 2024-06-14 ENCOUNTER — Ambulatory Visit: Payer: Self-pay | Admitting: Nurse Practitioner

## 2024-06-14 ENCOUNTER — Encounter: Payer: Self-pay | Admitting: Nurse Practitioner

## 2024-06-14 VITALS — BP 116/70 | HR 100 | Temp 98.2°F | Ht 62.5 in | Wt 145.4 lb

## 2024-06-14 DIAGNOSIS — E782 Mixed hyperlipidemia: Secondary | ICD-10-CM

## 2024-06-14 DIAGNOSIS — Z0001 Encounter for general adult medical examination with abnormal findings: Secondary | ICD-10-CM

## 2024-06-14 DIAGNOSIS — Z1211 Encounter for screening for malignant neoplasm of colon: Secondary | ICD-10-CM

## 2024-06-14 LAB — COMPREHENSIVE METABOLIC PANEL WITH GFR
ALT: 28 U/L (ref 0–35)
AST: 25 U/L (ref 0–37)
Albumin: 5 g/dL (ref 3.5–5.2)
Alkaline Phosphatase: 36 U/L — ABNORMAL LOW (ref 39–117)
BUN: 12 mg/dL (ref 6–23)
CO2: 27 meq/L (ref 19–32)
Calcium: 10 mg/dL (ref 8.4–10.5)
Chloride: 102 meq/L (ref 96–112)
Creatinine, Ser: 0.72 mg/dL (ref 0.40–1.20)
GFR: 100.66 mL/min (ref >60.00)
Glucose, Bld: 98 mg/dL (ref 70–99)
Potassium: 4.2 meq/L (ref 3.5–5.1)
Sodium: 136 meq/L (ref 135–145)
Total Bilirubin: 1 mg/dL (ref 0.2–1.2)
Total Protein: 7.9 g/dL (ref 6.0–8.3)

## 2024-06-14 LAB — LIPID PANEL
Cholesterol: 312 mg/dL — ABNORMAL HIGH (ref 0–200)
HDL: 96.5 mg/dL (ref >39.00)
LDL Cholesterol: 205 mg/dL — ABNORMAL HIGH (ref 0–99)
NonHDL: 215.44
Total CHOL/HDL Ratio: 3
Triglycerides: 51 mg/dL (ref 0.0–149.0)
VLDL: 10.2 mg/dL (ref 0.0–40.0)

## 2024-06-14 LAB — CBC
HCT: 41 % (ref 36.0–46.0)
Hemoglobin: 13.7 g/dL (ref 12.0–15.0)
MCHC: 33.4 g/dL (ref 30.0–36.0)
MCV: 89.6 fl (ref 78.0–100.0)
Platelets: 272 K/uL (ref 150.0–400.0)
RBC: 4.57 Mil/uL (ref 3.87–5.11)
RDW: 13.8 % (ref 11.5–15.5)
WBC: 4.7 K/uL (ref 4.0–10.5)

## 2024-06-14 LAB — TSH: TSH: 0.68 u[IU]/mL (ref 0.35–5.50)

## 2024-06-14 MED ORDER — EZETIMIBE 10 MG PO TABS: 10.0000 mg | ORAL_TABLET | Freq: Every day | ORAL | 1 refills | Status: AC

## 2024-06-14 NOTE — Patient Instructions (Signed)
 Go to lab Maintain Heart healthy diet and daily exercise. Maintain current medications.

## 2024-06-14 NOTE — Progress Notes (Signed)
 Complete physical exam  Patient: Patty Garner   DOB: 09-18-1980   43 y.o. Female  MRN: [id number] Visit Date: 06/14/2024  Subjective:    Chief Complaint  Patient presents with   Annual Exam    FASTING    Patty Garner is a 43 y.o. female who presents today for a complete physical exam. She reports consuming a general diet. Strength training and cardio 3x/week She generally feels well. She reports sleeping well. She does not have additional problems to discuss today.  Vision:Yes Dental:Yes STD Screen:No  Here to re establish care, Last OV 06/19/2021.  BP Readings from Last 3 Encounters:  06/14/24 116/70  09/14/23 123/83  10/29/22 129/83   Wt Readings from Last 3 Encounters:  06/14/24 145 lb 6.4 oz (66 kg)  04/10/22 143 lb (64.9 kg)  07/03/21 159 lb (72.1 kg)   Most recent fall risk assessment:    06/14/2024   10:05 AM  Fall Risk   Falls in the past year? 0  Number falls in past yr: 0  Injury with Fall? 0  Risk for fall due to : No Fall Risks  Follow up Falls evaluation completed   Depression screen:Yes - No Depression  Most recent depression screenings:    06/14/2024   10:05 AM 06/19/2021    9:04 AM  PHQ 2/9 Scores  PHQ - 2 Score 0 0  PHQ- 9 Score 0    HPI  No problem-specific Assessment & Plan notes found for this encounter.  Past Medical History:  Diagnosis Date   Allergy 03/19/1993   DJD (degenerative joint disease) 04/02/2021   LOWER BACK   Dysmenorrhea    Hypercholesteremia    Orthostatic hypotension    STD (sexually transmitted disease)    chlamydia as teenager   Past Surgical History:  Procedure Laterality Date   ABDOMINAL HYSTERECTOMY  04/09/2021   CYSTOSCOPY N/A 04/09/2021   Procedure: CYSTOSCOPY;  Surgeon: Cathlyn JAYSON Nikki Bobie FORBES, MD;  Location: [facility];  Service: Gynecology;  Laterality: N/A;   FRACTURE SURGERY  2004   Broke ankle   fractured ankle Left    20 YRS AGO, PLATE AND 7 SCREWS STILL IN PER PT ON  04-02-2021   MOUTH SURGERY  12/2020   METAL BAR PUT IN TO PUT TEETH BACK IN PLACE   TOTAL LAPAROSCOPIC HYSTERECTOMY WITH SALPINGECTOMY Bilateral 04/09/2021   Procedure: TOTAL LAPAROSCOPIC HYSTERECTOMY WITH BILATERAL SALPINGECTOMY, FULGURATION OF ENDOMETRIOSIS;  Surgeon: Cathlyn JAYSON Nikki Bobie FORBES, MD;  Location: [facility];  Service: Gynecology;  Laterality: Bilateral;   Social History   Socioeconomic History   Marital status: Divorced    Spouse name: Not on file   Number of children: Not on file   Years of education: Not on file   Highest education level: Master's degree (e.g., MA, MS, MEng, MEd, MSW, MBA)  Occupational History   Not on file  Tobacco Use   Smoking status: Former    Current packs/day: 0.00    Types: Cigarettes    Quit date: 07/21/2004    Years since quitting: 19.9   Smokeless tobacco: Never   Tobacco comments:    Social smoker. Never bought packs  Vaping Use   Vaping status: Never Used  Substance and Sexual Activity   Alcohol use: Not Currently   Drug use: Never   Sexual activity: Not Currently    Birth control/protection: None    Comment: Female partner  Other Topics Concern   Not on  file  Social History Narrative   Not on file   Social Drivers of Health   Financial Resource Strain: Low Risk  (06/13/2024)   Overall Financial Resource Strain (CARDIA)    Difficulty of Paying Living Expenses: Not hard at all  Food Insecurity: No Food Insecurity (06/13/2024)   Hunger Vital Sign    Worried About Running Out of Food in the Last Year: Never true    Ran Out of Food in the Last Year: Never true  Transportation Needs: No Transportation Needs (06/13/2024)   PRAPARE - Administrator, Civil Service (Medical): No    Lack of Transportation (Non-Medical): No  Physical Activity: Sufficiently Active (06/13/2024)   Exercise Vital Sign    Days of Exercise per Week: 5 days    Minutes of Exercise per Session: 60 min  Stress: No Stress Concern  Present (06/13/2024)   [employer] of Occupational Health - Occupational Stress Questionnaire    Feeling of Stress: Not at all  Social Connections: Moderately Integrated (06/13/2024)   Social Connection and Isolation Panel    Frequency of Communication with Friends and Family: More than three times a week    Frequency of Social Gatherings with Friends and Family: Once a week    Attends Religious Services: More than 4 times per year    Active Member of [employer] or Organizations: Yes    Attends Banker Meetings: More than 4 times per year    Marital Status: Divorced  Intimate Partner Violence: Not At Risk (06/14/2024)   Humiliation, Afraid, Rape, and Kick questionnaire    Fear of Current or Ex-Partner: No    Emotionally Abused: No    Physically Abused: No    Sexually Abused: No   Family Status  Relation Name Status   Mother  Alive   Father  Alive   Brother  Alive   MGM Nanny Deceased   MGF  Deceased   PGM  Deceased   PGF  Deceased   Maternal GGM  Alive   Neg Hx  (Not Specified)  No partnership data on file   Family History  Problem Relation Age of Onset   Diabetes Maternal Grandmother    Heart disease Maternal Grandmother    Hypertension Maternal Grandmother    Cancer Maternal Great-grandmother        colon cancer   Breast cancer Neg Hx    Allergies  Allergen Reactions   Statins     myalgias   Tylenol  With Codeine #3 [Acetaminophen -Codeine] Nausea And Vomiting    Patient Care Team: Jazleen Robeck, Roselie Rockford, NP as PCP - General (Internal Medicine) Cathlyn JAYSON Nikki Bobie FORBES, MD as Consulting Physician (Obstetrics and Gynecology)   Medications: Outpatient Medications Prior to Visit  Medication Sig   [DISCONTINUED] amoxicillin -clavulanate (AUGMENTIN ) 875-125 MG tablet Take 1 tablet by mouth every 12 (twelve) hours.   [DISCONTINUED] fluconazole  (DIFLUCAN ) 150 MG tablet Take one tablet today and one tablet in 3 days if symptoms persist.   No  facility-administered medications prior to visit.    Review of Systems  Constitutional:  Negative for activity change, appetite change and unexpected weight change.  Respiratory: Negative.    Cardiovascular: Negative.   Gastrointestinal: Negative.   Endocrine: Negative for cold intolerance and heat intolerance.  Genitourinary: Negative.   Musculoskeletal: Negative.   Skin: Negative.   Neurological: Negative.   Hematological: Negative.   Psychiatric/Behavioral:  Negative for behavioral problems, decreased concentration, dysphoric mood, hallucinations, self-injury, sleep disturbance and suicidal ideas.  The patient is not nervous/anxious.        Objective:  BP 116/70 (BP Location: Left Arm, Patient Position: Sitting, Cuff Size: Small)   Pulse 100   Temp 98.2 F (36.8 C) (Oral)   Ht 5' 2.5 (1.588 m)   Wt 145 lb 6.4 oz (66 kg)   LMP 04/02/2021 (Exact Date)   SpO2 98%   BMI 26.17 kg/m     Physical Exam Vitals and nursing note reviewed.  Constitutional:      General: She is not in acute distress. HENT:     Right Ear: Tympanic membrane, ear canal and external ear normal.     Left Ear: Tympanic membrane, ear canal and external ear normal.     Nose: Nose normal.  Eyes:     Extraocular Movements: Extraocular movements intact.     Conjunctiva/sclera: Conjunctivae normal.     Pupils: Pupils are equal, round, and reactive to light.  Neck:     Thyroid : No thyroid  mass, thyromegaly or thyroid  tenderness.  Cardiovascular:     Rate and Rhythm: Normal rate and regular rhythm.     Pulses: Normal pulses.     Heart sounds: Normal heart sounds.  Pulmonary:     Effort: Pulmonary effort is normal.     Breath sounds: Normal breath sounds.  Abdominal:     General: Bowel sounds are normal.     Palpations: Abdomen is soft.  Musculoskeletal:        General: Normal range of motion.     Cervical back: Normal range of motion and neck supple.     Right lower leg: No edema.     Left lower  leg: No edema.  Lymphadenopathy:     Cervical: No cervical adenopathy.  Skin:    General: Skin is warm and dry.  Neurological:     Mental Status: She is alert and oriented to person, place, and time.     Cranial Nerves: No cranial nerve deficit.  Psychiatric:        Mood and Affect: Mood normal.        Behavior: Behavior normal.        Thought Content: Thought content normal.     No results found for any visits on 06/14/24.    Assessment & Plan:    Routine Health Maintenance and Physical Exam  Immunization History  Administered Date(s) Administered   Tdap 08/25/2017    Health Maintenance  Topic Date Due   Colonoscopy  Never done   Influenza Vaccine  10/18/2024 (Originally 02/19/2024)   Hepatitis B Vaccines 19-59 Average Risk (1 of 3 - 19+ 3-dose series) 06/14/2025 (Originally 08/03/1997)   HPV VACCINES (1 - 3-dose SCDM series) 06/14/2025 (Originally 08/03/2005)   Hepatitis C Screening  06/14/2025 (Originally 08/03/1996)   Mammogram  05/13/2025   DTaP/Tdap/Td (2 - Td or Tdap) 08/26/2027   HIV Screening  Completed   Pneumococcal Vaccine  Aged Out   Meningococcal B Vaccine  Aged Out   COVID-19 Vaccine  Discontinued   Discussed health benefits of physical activity, and encouraged her to engage in regular exercise appropriate for her age and condition. Advised to schedule appointment for mammogram and colonoscopy  Problem List Items Addressed This Visit     HLD (hyperlipidemia)   Relevant Orders   Lipid panel   Other Visit Diagnoses       Encounter for preventative adult health care exam with abnormal findings    -  Primary   Relevant Orders  Comprehensive metabolic panel with GFR   CBC   TSH     Colon cancer screening       Relevant Orders   Ambulatory referral to Gastroenterology      Return in about 1 year (around 06/14/2025) for CPE (fasting).     Roselie Mood, NP

## 2024-06-20 ENCOUNTER — Other Ambulatory Visit: Payer: Self-pay | Admitting: Nurse Practitioner

## 2024-06-20 DIAGNOSIS — Z1231 Encounter for screening mammogram for malignant neoplasm of breast: Secondary | ICD-10-CM

## 2024-07-15 ENCOUNTER — Ambulatory Visit

## 2024-07-15 DIAGNOSIS — Z1231 Encounter for screening mammogram for malignant neoplasm of breast: Secondary | ICD-10-CM

## 2024-08-19 ENCOUNTER — Encounter: Payer: Self-pay | Admitting: Nurse Practitioner

## 2025-06-20 ENCOUNTER — Encounter: Admitting: Nurse Practitioner
